# Patient Record
Sex: Male | Born: 1990 | Race: White | Hispanic: No | Marital: Married | Smoking: Never smoker
Health system: Southern US, Community
[De-identification: ages and names within clinical notes are randomized; demographics above are authoritative.]

## PROBLEM LIST (undated history)

## (undated) DIAGNOSIS — Z789 Other specified health status: Secondary | ICD-10-CM

## (undated) HISTORY — DX: Other specified health status: Z78.9

## (undated) HISTORY — PX: NO PAST SURGERIES: SHX2092

---

## 2011-03-04 ENCOUNTER — Encounter: Payer: Self-pay | Admitting: Family Medicine

## 2011-03-04 ENCOUNTER — Ambulatory Visit (INDEPENDENT_AMBULATORY_CARE_PROVIDER_SITE_OTHER): Payer: BC Managed Care – PPO | Admitting: Family Medicine

## 2011-03-04 VITALS — BP 110/80 | HR 80 | Temp 98.5°F | Ht 72.0 in | Wt 204.8 lb

## 2011-03-04 DIAGNOSIS — Z0000 Encounter for general adult medical examination without abnormal findings: Secondary | ICD-10-CM | POA: Insufficient documentation

## 2011-03-04 NOTE — Patient Instructions (Addendum)
Good to meet you today.  Call us with questions.  Use sunscreen when outside for prolonged time (>10-22min). Look into recent blood work and bring [place] a copy for your records. Come back as needed or in 2 years for next checkup. Good luck with school!  Keeping you healthy  Get these tests  Blood pressure- Have your blood pressure checked once a year by your healthcare provider.  Normal blood pressure is 120/80.  Weight- Have your body mass index (BMI) calculated to screen for obesity.  BMI is a measure of body fat based on height and weight. You can also calculate your own BMI at https://www.west-esparza.com/.  Diabetes- Have your blood sugar checked regularly if you have high blood pressure, high cholesterol, a family history of diabetes or if you are overweight.  Get these vaccines  Flu shot- Every fall.  Tetanus shot- Every 10 years.  Menactra- Single dose; prevents meningitis.  Take these steps  Don't smoke- If you do smoke, ask your healthcare provider about quitting. For tips on how to quit, go to www.smokefree.gov or call 1-800-QUIT-NOW.  Be physically active- Exercise 5 days a week for at least 30 minutes.  If you are not already physically active start slow and gradually work up to 30 minutes of moderate physical activity.  Examples of moderate activity include walking briskly, mowing the yard, dancing, swimming bicycling, etc.  Eat a healthy diet- Eat a variety of healthy foods such as fruits, vegetables, low fat milk, low fat cheese, yogurt, lean meats, poultry, fish, beans, tofu, etc.  For more information on healthy eating, go to www.thenutritionsource.org  Drink alcohol in moderation- Limit alcohol intake two drinks or less a day.  Never drink and drive.  Dentist- Brush and floss teeth twice daily; visit your dentis twice a year.  Depression-Your emotional health is as important as your physical health.  If you're feeling down, losing interest in things you normally enjoy  please talk with your healthcare provider.  Gun Safety- If you keep a gun in your home, keep it unloaded and with the safety lock on.  Bullets should be stored separately.  Helmet use- Always wear a helmet when riding a motorcycle, bicycle, rollerblading or skateboarding.  Safe sex- If you may be exposed to a sexually transmitted infection, use a condom  Seat belts- Seat bels can save your life; always wear one.  Smoke/Carbon Monoxide detectors- These detectors need to be installed on the appropriate level of your home.  Replace batteries at least once a year.  Skin Cancer- When out in the sun, cover up and use sunscreen SPF 15 or higher.  Violence- If anyone is threatening or hurting you, please tell your healthcare provider.

## 2011-03-04 NOTE — Progress Notes (Signed)
Subjective:    Patient ID: Jaclyn Prime Schuknecht, male    DOB: Mar 12, 1990, 21 y.o.   MRN: [id number]  HPI CC: new pt, CPE  Would like CPE today.  New pt, first time here.  No concerns today.  Lives with wife.  No children, no pets.  Preventative: Unsure last CPE.   No recent blood work.  Had blood work done for [employer].  Will check at home for copy of latest labs. Flu - did not receive Tetanus - ~2008.   Seat belt use 100% Discussed sunscreen use.  Caffeine: 1 cup coffee/day Lives with wife, [name], no pets Occupation: Consulting civil engineer - Arts administrator, senior, middle school education Edu: college Activity: basketball, running Diet: water, fruits/vegetables daily, red meat 3-4x/wk, fish 1-2x/wk  Medications and allergies reviewed and updated in chart.  Past histories reviewed and updated if relevant as below. There is no problem list on file for this patient.  Past Medical History  Diagnosis Date  . No pertinent past medical history    Past Surgical History  Procedure Date  . No past surgeries    History  Substance Use Topics  . Smoking status: Never Smoker   . Smokeless tobacco: Never Used  . Alcohol Use: Yes     once weekly, 3-4 beers at a time, no binging   Family History  Problem Relation Age of Onset  . Stroke Maternal Grandfather 60  . Coronary artery disease Neg Hx   . Diabetes Neg Hx   . Cancer Neg Hx   . Hypertension Neg Hx   . Hyperlipidemia Neg Hx    No Known Allergies No current outpatient prescriptions on file prior to visit.     Review of Systems  Constitutional: Negative for fever, chills, activity change, appetite change, fatigue and unexpected weight change.  HENT: Negative for hearing loss and neck pain.   Eyes: Negative for visual disturbance.  Respiratory: Negative for cough, chest tightness, shortness of breath and wheezing.   Cardiovascular: Negative for chest pain, palpitations and leg swelling.  Gastrointestinal: Negative for nausea, vomiting,  abdominal pain, diarrhea, constipation, blood in stool and abdominal distention.  Genitourinary: Negative for hematuria and difficulty urinating.  Musculoskeletal: Negative for myalgias and arthralgias.  Skin: Negative for rash.  Neurological: Negative for dizziness, seizures, syncope and headaches.  Hematological: Does not bruise/bleed easily.  Psychiatric/Behavioral: Negative for dysphoric mood. The patient is not nervous/anxious.        Objective:   Physical Exam  Nursing note and vitals reviewed. Constitutional: He is oriented to person, place, and time. He appears well-developed and well-nourished. No distress.  HENT:  Head: Normocephalic and atraumatic.  Right Ear: Hearing, tympanic membrane, external ear and ear canal normal.  Left Ear: Hearing, tympanic membrane, external ear and ear canal normal.  Nose: Nose normal.  Mouth/Throat: Uvula is midline, oropharynx is clear and moist and mucous membranes are normal. No oropharyngeal exudate, posterior oropharyngeal edema, posterior oropharyngeal erythema or tonsillar abscesses.  Eyes: Conjunctivae and EOM are normal. Pupils are equal, round, and reactive to light. No scleral icterus.  Neck: Normal range of motion. Neck supple. No thyromegaly present.  Cardiovascular: Normal rate, regular rhythm, normal heart sounds and intact distal pulses.   No murmur heard. Pulses:      Radial pulses are 2+ on the right side, and 2+ on the left side.  Pulmonary/Chest: Effort normal and breath sounds normal. No respiratory distress. He has no wheezes. He has no rales.  Abdominal: Soft. Bowel sounds are  normal. He exhibits no distension and no mass. There is no tenderness. There is no rebound and no guarding.  Musculoskeletal: Normal range of motion. He exhibits no edema.  Lymphadenopathy:    He has no cervical adenopathy.  Neurological: He is alert and oriented to person, place, and time.       CN grossly intact, station and gait intact  Skin:  Skin is warm and dry. No rash noted.  Psychiatric: He has a normal mood and affect. His behavior is normal. Judgment and thought content normal.       Assessment & Plan:

## 2011-03-04 NOTE — Assessment & Plan Note (Signed)
Preventative protocols reviewed and updated. Discussed healthy living, rec sunscreen use. rtc 2 yrs for next CPE, sooner if needed. Pt will bring copy of latest blood work (last year) for records.

## 2011-04-04 ENCOUNTER — Emergency Department: Payer: Self-pay | Admitting: Emergency Medicine

## 2011-04-10 ENCOUNTER — Encounter: Payer: Self-pay | Admitting: Family Medicine

## 2011-04-10 ENCOUNTER — Ambulatory Visit (INDEPENDENT_AMBULATORY_CARE_PROVIDER_SITE_OTHER): Payer: BC Managed Care – PPO | Admitting: Family Medicine

## 2011-04-10 VITALS — BP 104/72 | HR 76 | Temp 98.4°F | Wt 200.8 lb

## 2011-04-10 DIAGNOSIS — S0180XA Unspecified open wound of other part of head, initial encounter: Secondary | ICD-10-CM

## 2011-04-10 DIAGNOSIS — S0181XA Laceration without foreign body of other part of head, initial encounter: Secondary | ICD-10-CM

## 2011-04-10 DIAGNOSIS — J069 Acute upper respiratory infection, unspecified: Secondary | ICD-10-CM

## 2011-04-10 NOTE — Assessment & Plan Note (Signed)
Suture removed.  healing well. Continue to monitor. Discussed red flags to return or seek care - ie spreading redness, worsening pain, draining pus or not healing as expected.

## 2011-04-10 NOTE — Assessment & Plan Note (Signed)
Anticipate viral. supportive care as per instructions. Discussed sxs to watch for bacterial superinfection.

## 2011-04-10 NOTE — Progress Notes (Signed)
  Subjective:    Patient ID: Eric Morse, male    DOB: 1990-04-12, 21 y.o.   MRN: [id number]  HPI CC: f/u [facility], stitch removal, URI?  DOI: 3/22 - playing basketball, elbow to face. Seen at ER 3/22 with lac to face, placed 1 stitch. Denies redness, draining, residual bleeding.  4d h/o URI sxs - coryza, ST, RN, PNdrainage.  No coughing.  No fevers/chills, abd pain.  So far tried nothing for this OTC.  Wife recently with sinus infection.  Review of Systems Per HPI    Objective:   Physical Exam  Nursing note and vitals reviewed. Constitutional: He appears well-developed and well-nourished. No distress.  HENT:  Head: Normocephalic and atraumatic.    Right Ear: Hearing and external ear normal.  Left Ear: Hearing, tympanic membrane, external ear and ear canal normal.  Nose: Nose normal. No mucosal edema or rhinorrhea.  Mouth/Throat: Uvula is midline, oropharynx is clear and moist and mucous membranes are normal. No oropharyngeal exudate, posterior oropharyngeal edema, posterior oropharyngeal erythema or tonsillar abscesses.       R canal with cerumen L chin with laceration that goes through to mouth - lower lip.  Scab in place. No spreading erythema on outside or pus drainage.  Inside with minimal erythema surrounding lesion   Eyes: Conjunctivae and EOM are normal. Pupils are equal, round, and reactive to light. No scleral icterus.  Neck: Normal range of motion. Neck supple.  Cardiovascular: Normal rate, regular rhythm, normal heart sounds and intact distal pulses.   No murmur heard. Pulmonary/Chest: Effort normal and breath sounds normal. No respiratory distress. He has no wheezes. He has no rales.  Lymphadenopathy:    He has no cervical adenopathy.  Skin: Skin is warm and dry. No rash noted.  Psychiatric: He has a normal mood and affect.       Assessment & Plan:  1 suture removed with suture removal scissors. Pt tolerated procedure well.

## 2011-05-26 ENCOUNTER — Encounter: Payer: Self-pay | Admitting: Family Medicine

## 2011-05-26 ENCOUNTER — Ambulatory Visit (INDEPENDENT_AMBULATORY_CARE_PROVIDER_SITE_OTHER): Payer: BC Managed Care – PPO | Admitting: Family Medicine

## 2011-05-26 ENCOUNTER — Ambulatory Visit: Payer: BC Managed Care – PPO | Admitting: Family Medicine

## 2011-05-26 VITALS — BP 112/80 | HR 62 | Temp 97.9°F | Ht 72.0 in | Wt 197.0 lb

## 2011-05-26 DIAGNOSIS — M7670 Peroneal tendinitis, unspecified leg: Secondary | ICD-10-CM

## 2011-05-26 DIAGNOSIS — M7750 Other enthesopathy of unspecified foot: Secondary | ICD-10-CM

## 2011-05-26 NOTE — Patient Instructions (Signed)
Peroneal Tendon rehab: start next week Begin with easy walking, heel, toe and backwards * Try to pick an easy location like a hallway or a room in your house and do one of these each time that you go through this area.  Calf raises on a step - Pidgeon Toes, with toes turned inward Try to do most days of the week If pain persists at 3 sets of 30 - add backpack with 5 lbs Increase by 5 lbs per week to max of 30 lbs  Towel "Scrunch Ups" Use a hand towel or a moderate size towel Foot flat down on the towel Use toes to "scrunch up the towel" straight up and down, and going to the right and left.  3 sets of 20 * Can be done watching TV, reading, or sitting and relaxing.

## 2011-05-26 NOTE — Progress Notes (Signed)
  Patient Name: Eric Morse Date of Birth: 10/16/1990 Age: 21 y.o. Medical Record Number: [id number] Gender: male Date of Encounter: 05/26/2011  History of Present Illness:  Eric Morse is a 21 y.o. very pleasant male patient who presents with the following:  1 week ran 6 days ago, lateral foot pain after running 13 miles. He is training for a half marathon. He is a regular runner, and usually runs about 20 miles a week. He is able to walk, and it has gotten better a little bit in the last 1-2 days.  Primarily he has pain with eversion. No bruising. No swelling.  Past Medical History, Surgical History, Social History, Family History, Problem List, Medications, and Allergies have been reviewed and updated if relevant.  Review of Systems:  GEN: No fevers, chills. Nontoxic. Primarily MSK c/o today. MSK: Detailed in the HPI GI: tolerating PO intake without difficulty Neuro: No numbness, parasthesias, or tingling associated. Otherwise the pertinent positives of the ROS are noted above.    Physical Examination: Filed Vitals:   05/26/11 1104  BP: 112/80  Pulse: 62  Temp: 97.9 F (36.6 C)  TempSrc: Oral  Height: 6' (1.829 m)  Weight: 197 lb (89.359 kg)  SpO2: 98%    Body mass index is 26.72 kg/(m^2).   GEN: WDWN, NAD, Non-toxic, Alert & Oriented x 3 HEENT: Atraumatic, Normocephalic.  Ears and Nose: No external deformity. EXTR: No clubbing/cyanosis/edema NEURO: Normal gait.  PSYCH: Normally interactive. Conversant. Not depressed or anxious appearing.  Calm demeanor.   Entirety of his bony anatomy is nontender in the RIGHT foot. 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 Pain is primarily with eversion and he is unable to fully go up on his toes and fever his foot on one foot.  Assessment and Plan: 1. Peroneal tendonitis     Refer to the patient instructions sections for details of plan shared with patient.   rtp after about a week, more cross training for a week or so  Ankle  support during work, when starting back running

## 2011-06-12 ENCOUNTER — Encounter: Payer: Self-pay | Admitting: Family Medicine

## 2011-06-12 ENCOUNTER — Ambulatory Visit (INDEPENDENT_AMBULATORY_CARE_PROVIDER_SITE_OTHER): Payer: BC Managed Care – PPO | Admitting: Family Medicine

## 2011-06-12 VITALS — BP 110/70 | HR 72 | Temp 97.4°F | Wt 197.2 lb

## 2011-06-12 DIAGNOSIS — L600 Ingrowing nail: Secondary | ICD-10-CM | POA: Insufficient documentation

## 2011-06-12 NOTE — Patient Instructions (Signed)
Warm water soaks or warm compresses to help with inflammation.  pass by [facility]'s office for referral to podiatrist.

## 2011-06-12 NOTE — Assessment & Plan Note (Signed)
Unable to raise toenail edge past lateral nail fold. Will likely need partial toenail removal as more definitive treatment, will refer to podiatry. rec warm compresses for next few days to calm down inflammation. No obvious infection currently, improved over last few days.

## 2011-06-12 NOTE — Progress Notes (Signed)
  Subjective:    Patient ID: Eric Morse, male    DOB: [birth year], 21 y.o.   MRN: [id number]  HPI CC: R big toe pain  Training for 1/2 marathon in October in [place] area.  Doing 5-6 mi a few times a week.  Since last seen by myself, seen by Dr. Salena Saner and dx with R peroneal tendonitis.  That has improved.  R big toe pain worse over last few days.  Has been sensitive for last few years.  Cut toenail on Monday and after that very painful and sensitive to touch.  Also had some blood and pus drain from toe recently.    No h/o ingrown toenail in past.  Medications and allergies reviewed and updated in chart.  Past histories reviewed and updated if relevant as below. Patient Active Problem List  Diagnoses  . Health care maintenance  . Laceration of face  . Viral URI  . Ingrown toenail   Past Medical History  Diagnosis Date  . No pertinent past medical history    Past Surgical History  Procedure Date  . No past surgeries    History  Substance Use Topics  . Smoking status: Never Smoker   . Smokeless tobacco: Never Used  . Alcohol Use: Yes     once weekly, 3-4 beers at a time, no binging   Family History  Problem Relation Age of Onset  . Stroke Maternal Grandfather 60  . Coronary artery disease Neg Hx   . Diabetes Neg Hx   . Cancer Neg Hx   . Hypertension Neg Hx   . Hyperlipidemia Neg Hx    No Known Allergies No current outpatient prescriptions on file prior to visit.    Review of Systems Per HPI    Objective:   Physical Exam  Nursing note and vitals reviewed. Constitutional: He appears well-developed and well-nourished. No distress.  Cardiovascular:  Pulses:      Dorsalis pedis pulses are 2+ on the right side, and 2+ on the left side.  Musculoskeletal:       R great toe lateral edge with some swelling and tender to palpation, no significant erythema or warmth. Lateral nail edge growing into distal toe.  Skin: Skin is warm and dry. No rash noted.         Assessment & Plan:

## 2012-01-01 ENCOUNTER — Telehealth: Payer: Self-pay | Admitting: Family Medicine

## 2012-01-01 DIAGNOSIS — IMO0001 Reserved for inherently not codable concepts without codable children: Secondary | ICD-10-CM

## 2012-01-01 NOTE — Telephone Encounter (Signed)
Received request to fill out physical form for job. He needs to come in for office visit to discuss.  May need blood work to check immunization titers - or bring me copy of immunization records. Placed form in Kim's box

## 2012-01-01 NOTE — Telephone Encounter (Signed)
Patient notified and appt scheduled. He's not sure if he can locate vaccine records.

## 2012-01-05 NOTE — Telephone Encounter (Addendum)
Spoke with pt and discussed all below - actually had physical 02/2011.  Doesn't need appt for forms to fill out. Selena Batten - can we call to reschedule nurse visit for PPD placement and at that time draw blood work for titers. Thinks received Td 2008, but unsure.  If does not bring immunization record, let's give him Tdap when he comes in for blood work. Placed work form in [employer]

## 2012-01-05 NOTE — Telephone Encounter (Signed)
Patient notified and nurse visit and lab appt scheduled. OV cancelled.

## 2012-01-05 NOTE — Telephone Encounter (Signed)
Please order labs.

## 2012-01-06 NOTE — Addendum Note (Signed)
Addended by: Eustaquio Boyden on: 01/06/2012 01:23 PM   Modules accepted: Orders

## 2012-01-08 ENCOUNTER — Ambulatory Visit: Payer: BC Managed Care – PPO | Admitting: Family Medicine

## 2012-01-16 ENCOUNTER — Other Ambulatory Visit (INDEPENDENT_AMBULATORY_CARE_PROVIDER_SITE_OTHER): Payer: BC Managed Care – PPO

## 2012-01-16 ENCOUNTER — Ambulatory Visit: Payer: BC Managed Care – PPO

## 2012-01-16 DIAGNOSIS — Z9189 Other specified personal risk factors, not elsewhere classified: Secondary | ICD-10-CM

## 2012-01-16 DIAGNOSIS — IMO0001 Reserved for inherently not codable concepts without codable children: Secondary | ICD-10-CM

## 2012-01-17 LAB — HEPATITIS B SURFACE ANTIBODY,QUALITATIVE: Hep B S Ab: REACTIVE — AB

## 2012-01-19 ENCOUNTER — Ambulatory Visit (INDEPENDENT_AMBULATORY_CARE_PROVIDER_SITE_OTHER): Payer: BC Managed Care – PPO | Admitting: *Deleted

## 2012-01-19 DIAGNOSIS — Z23 Encounter for immunization: Secondary | ICD-10-CM

## 2012-01-19 DIAGNOSIS — Z139 Encounter for screening, unspecified: Secondary | ICD-10-CM

## 2012-01-19 LAB — MUMPS ANTIBODY, IGG: Mumps IgG: 3.15 {ISR} — ABNORMAL HIGH

## 2012-01-19 LAB — VARICELLA ZOSTER ANTIBODY, IGG: Varicella IgG: 0.58 {ISR} (ref <0.90)

## 2012-01-19 LAB — RUBELLA SCREEN: Rubella: 11.1 Index — ABNORMAL HIGH (ref <0.90)

## 2012-01-19 LAB — RUBEOLA ANTIBODY IGG: Rubeola IgG: 3.46 {ISR} — ABNORMAL HIGH

## 2012-01-21 LAB — TB SKIN TEST
Induration: NEGATIVE mm
TB Skin Test: NEGATIVE

## 2012-08-04 ENCOUNTER — Telehealth: Payer: Self-pay | Admitting: Family Medicine

## 2012-08-04 NOTE — Telephone Encounter (Signed)
Phill Mutter dropped off a Health Exam Certificate form to be completed by Dr. Sharen Hones. Please call him when the form is ready to be picked up at [phone].  Thanks, Revonda Standard

## 2012-08-04 NOTE — Telephone Encounter (Signed)
Form for completion in your IN box. He hasn't had a physical since 2/13. I know he will need PPD, hearing and vision, but do you need to see him or will nurse visit be ok?

## 2012-08-05 NOTE — Telephone Encounter (Signed)
Message left for patient to return my call and schedule appt.

## 2012-08-05 NOTE — Telephone Encounter (Signed)
plz have him schedule physical (may be 15 min slot).   Form placed in Kim's box.

## 2012-08-09 NOTE — Telephone Encounter (Signed)
Pt called and scheduled 15 min appt 08/10/12 at 8 am.

## 2012-08-10 ENCOUNTER — Encounter: Payer: Self-pay | Admitting: Family Medicine

## 2012-08-10 ENCOUNTER — Ambulatory Visit (INDEPENDENT_AMBULATORY_CARE_PROVIDER_SITE_OTHER): Payer: BC Managed Care – PPO | Admitting: Family Medicine

## 2012-08-10 VITALS — BP 124/78 | HR 68 | Temp 98.0°F | Ht 72.0 in | Wt 193.2 lb

## 2012-08-10 DIAGNOSIS — Z23 Encounter for immunization: Secondary | ICD-10-CM

## 2012-08-10 DIAGNOSIS — R229 Localized swelling, mass and lump, unspecified: Secondary | ICD-10-CM | POA: Insufficient documentation

## 2012-08-10 DIAGNOSIS — Z0000 Encounter for general adult medical examination without abnormal findings: Secondary | ICD-10-CM

## 2012-08-10 NOTE — Progress Notes (Signed)
Subjective:    Patient ID: Eric Morse, male    DOB: 1990/08/14, 22 y.o.   MRN: [id number]  HPI CC: CPE  Here for forms to fill out for work - [employer] system.  Starting new job.  To start at [employer], [employer]. Overall doing well, no concerns or complaints.  Would like bump on leg evaluated.  No change since high school.  Hearing and vision screens passed today.  Preventative:  Unsure last CPE.  No recent blood work. Had blood work done for [employer]. Will check at home for copy of latest labs.  Flu - did not receive  Tdap 01/2012 PPD 01/2012 negative  Seat belt use 100%. Discussed sunscreen use.  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  Past histories reviewed and updated if relevant as below. Patient Active Problem List   Diagnosis Date Noted  . Ingrown toenail 06/12/2011  . Health care maintenance 03/04/2011   Past Medical History  Diagnosis Date  . No pertinent past medical history    Past Surgical History  Procedure Laterality Date  . No past surgeries     History  Substance Use Topics  . Smoking status: Never Smoker   . Smokeless tobacco: Never Used  . Alcohol Use: Yes     Comment: once weekly, 3-4 beers at a time, no binging   Family History  Problem Relation Age of Onset  . Stroke Maternal Grandfather 60  . Coronary artery disease Neg Hx   . Diabetes Neg Hx   . Cancer Neg Hx   . Hypertension Neg Hx   . Hyperlipidemia Neg Hx    No Known Allergies No current outpatient prescriptions on file prior to visit.   No current facility-administered medications on file prior to visit.     Review of Systems  Constitutional: Negative for fever,  chills, activity change, appetite change, fatigue and unexpected weight change.  HENT: Negative for hearing loss and neck pain.   Eyes: Negative for visual disturbance.  Respiratory: Negative for cough, chest tightness, shortness of breath and wheezing.   Cardiovascular: Negative for chest pain, palpitations and leg swelling.  Gastrointestinal: Negative for nausea, vomiting, abdominal pain, diarrhea, constipation, blood in stool and abdominal distention.  Genitourinary: Negative for hematuria and difficulty urinating.  Musculoskeletal: Negative for myalgias and arthralgias.  Skin: Negative for rash.  Neurological: Negative for dizziness, seizures, syncope and headaches.  Hematological: Negative for adenopathy. Does not bruise/bleed easily.  Psychiatric/Behavioral: Negative for dysphoric mood. The patient is not nervous/anxious.        Objective:   Physical Exam  Nursing note and vitals reviewed. Constitutional: He is oriented to person, place, and time. He appears well-developed and well-nourished. No distress.  HENT:  Head: Normocephalic and atraumatic.  Right Ear: Hearing, tympanic membrane, external ear and ear canal normal.  Left Ear: Hearing, tympanic membrane, external ear and ear canal normal.  Nose: Nose normal.  Mouth/Throat: Oropharynx is clear and moist. No oropharyngeal exudate.  Eyes: Conjunctivae and EOM are normal. Pupils are equal, round, and reactive to light. No scleral icterus.  Neck: Normal range of motion. Neck supple. No thyromegaly present.  Cardiovascular: Normal rate, regular rhythm, normal heart sounds and intact distal pulses.   No murmur  heard. Pulses:      Radial pulses are 2+ on the right side, and 2+ on the left side.  Pulmonary/Chest: Effort normal and breath sounds normal. No respiratory distress. He has no wheezes. He has no rales.  Abdominal: Soft. Bowel sounds are normal. He exhibits no distension and no mass. There is no tenderness. There is no  rebound and no guarding.  Musculoskeletal: Normal range of motion.  Lymphadenopathy:    He has no cervical adenopathy.  Neurological: He is alert and oriented to person, place, and time.  CN grossly intact, station and gait intact  Skin: Skin is warm and dry. No rash noted.  R anterior thigh with 2cm superficial fixed fluid filled mass, overlying taught skin  Psychiatric: He has a normal mood and affect. His behavior is normal. Judgment and thought content normal.       Assessment & Plan:

## 2012-08-10 NOTE — Assessment & Plan Note (Signed)
Preventative protocols reviewed and updated unless pt declined. Discussed healthy diet and lifestyle. RTC PRN. No need for blood work today.

## 2012-08-10 NOTE — Patient Instructions (Addendum)
Good to see you today! Call us with questions. Your last PPD (TB skin test) was 01/19/2012. Second and final varicella shot done today. Return as needed or in 2 years for next physical

## 2012-08-10 NOTE — Assessment & Plan Note (Signed)
?  cyst vs subcutaneous hemangioma.  Monitor for now as not changing. If pt desires removal, or if any change, discussed referral to surgeon. Pt agrees with plan.

## 2012-08-10 NOTE — Addendum Note (Signed)
Addended by: Josph Macho A on: 08/10/2012 08:43 AM   Modules accepted: Orders

## 2015-02-14 ENCOUNTER — Other Ambulatory Visit: Payer: Self-pay | Admitting: Family Medicine

## 2015-02-14 DIAGNOSIS — Z13220 Encounter for screening for lipoid disorders: Secondary | ICD-10-CM

## 2015-02-14 DIAGNOSIS — Z131 Encounter for screening for diabetes mellitus: Secondary | ICD-10-CM

## 2015-02-16 ENCOUNTER — Other Ambulatory Visit (INDEPENDENT_AMBULATORY_CARE_PROVIDER_SITE_OTHER): Payer: BC Managed Care – PPO

## 2015-02-16 DIAGNOSIS — Z13220 Encounter for screening for lipoid disorders: Secondary | ICD-10-CM | POA: Diagnosis not present

## 2015-02-16 DIAGNOSIS — Z131 Encounter for screening for diabetes mellitus: Secondary | ICD-10-CM

## 2015-02-16 LAB — BASIC METABOLIC PANEL
BUN: 18 mg/dL (ref 7–25)
CO2: 27 mmol/L (ref 20–31)
Calcium: 9.9 mg/dL (ref 8.6–10.3)
Chloride: 103 mmol/L (ref 98–110)
Creat: 0.94 mg/dL (ref 0.60–1.35)
Glucose, Bld: 91 mg/dL (ref 65–99)
Potassium: 4.5 mmol/L (ref 3.5–5.3)
Sodium: 140 mmol/L (ref 135–146)

## 2015-02-16 LAB — LIPID PANEL
Cholesterol: 173 mg/dL (ref 125–200)
HDL: 46 mg/dL (ref >=40)
LDL Cholesterol: 108 mg/dL (ref <130)
Total CHOL/HDL Ratio: 3.8 Ratio (ref <=5.0)
Triglycerides: 94 mg/dL (ref <150)
VLDL: 19 mg/dL (ref <30)

## 2015-02-22 ENCOUNTER — Ambulatory Visit (INDEPENDENT_AMBULATORY_CARE_PROVIDER_SITE_OTHER): Payer: BC Managed Care – PPO | Admitting: Family Medicine

## 2015-02-22 ENCOUNTER — Encounter: Payer: Self-pay | Admitting: Family Medicine

## 2015-02-22 VITALS — BP 102/80 | HR 69 | Temp 97.7°F | Ht 72.0 in | Wt 202.0 lb

## 2015-02-22 DIAGNOSIS — Z0000 Encounter for general adult medical examination without abnormal findings: Secondary | ICD-10-CM | POA: Diagnosis not present

## 2015-02-22 NOTE — Progress Notes (Signed)
Pre visit review using our clinic review tool, if applicable. No additional management support is needed unless otherwise documented below in the visit note. 

## 2015-02-22 NOTE — Progress Notes (Signed)
BP 102/80 mmHg  Pulse 69  Temp(Src) 97.7 F (36.5 C) (Oral)  Ht 6' (1.829 m)  Wt 202 lb (91.627 kg)  BMI 27.39 kg/m2  SpO2 98%   CC: CPE  Subjective:    Patient ID: Eric Morse, male    DOB: [birth year], 25 y.o.   MRN: [id number]  HPI: Eric Morse is a 25 y.o. male presenting on 02/22/2015 for Annual Exam   Goes to [place], [place] every summer "Back to Back" works in [employer].   No known TB exposure. No cough, fever, night sweats. No unexpected weight changes. No work at [employer] or prison.  Preventative: Flu - declines  Tdap 01/2012 PPD 01/2012 negative Seat belt use 100%. Discussed sunscreen use. 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 Interim medical history since our last visit reviewed. Allergies and medications reviewed and updated. No current outpatient prescriptions on file prior to visit.   No current facility-administered medications on file prior to visit.    Review of Systems  Constitutional: Negative for fever, chills, activity change, appetite change, fatigue and unexpected weight change.  HENT: Negative for hearing loss.   Eyes: Negative for visual disturbance.  Respiratory: Negative for cough, chest tightness, shortness of breath and wheezing.   Cardiovascular: Negative for chest pain, palpitations and leg swelling.  Gastrointestinal: Negative for nausea, vomiting, abdominal pain, diarrhea, constipation, blood in stool and abdominal distention.  Genitourinary: Negative for hematuria and difficulty urinating.  Musculoskeletal: Negative for myalgias,  arthralgias and neck pain.  Skin: Negative for rash.  Neurological: Negative for dizziness, seizures, syncope and headaches.  Hematological: Negative for adenopathy. Does not bruise/bleed easily.  Psychiatric/Behavioral: Negative for dysphoric mood. The patient is not nervous/anxious.    Per HPI unless specifically indicated in ROS section     Objective:    BP 102/80 mmHg  Pulse 69  Temp(Src) 97.7 F (36.5 C) (Oral)  Ht 6' (1.829 m)  Wt 202 lb (91.627 kg)  BMI 27.39 kg/m2  SpO2 98%  Wt Readings from Last 3 Encounters:  02/22/15 202 lb (91.627 kg)  08/10/12 193 lb 4 oz (87.658 kg)  06/12/11 197 lb 4 oz (89.472 kg)    Physical Exam  Constitutional: He is oriented to person, place, and time. He appears well-developed and well-nourished. No distress.  HENT:  Head: Normocephalic and atraumatic.  Right Ear: Hearing, tympanic membrane, external ear and ear canal normal.  Left Ear: Hearing, tympanic membrane, external ear and ear canal normal.  Nose: Nose normal.  Mouth/Throat: Uvula is midline, oropharynx is clear and moist and mucous membranes are normal. No oropharyngeal exudate, posterior oropharyngeal edema or posterior oropharyngeal erythema.  Eyes: Conjunctivae and EOM are normal. Pupils are equal, round, and reactive to light. No scleral icterus.  Neck: Normal range of motion. Neck supple. No thyromegaly present.  Cardiovascular: Normal rate, regular rhythm, normal heart sounds and intact distal pulses.   No murmur heard. Pulses:      Radial pulses are 2+ on the right side, and 2+ on the left side.  Pulmonary/Chest: Effort normal and breath sounds normal. No respiratory distress. He has no wheezes. He has  no rales.  Abdominal: Soft. Bowel sounds are normal. He exhibits no distension and no mass. There is no tenderness. There is no rebound and no guarding.  Musculoskeletal: Normal range of motion. He exhibits no edema.  Lymphadenopathy:    He has no cervical adenopathy.    Neurological: He is alert and oriented to person, place, and time.  CN grossly intact, station and gait intact  Skin: Skin is warm and dry. No rash noted.  Psychiatric: He has a normal mood and affect. His behavior is normal. Judgment and thought content normal.  Nursing note and vitals reviewed.  Results for orders placed or performed in visit on 02/16/15  Basic metabolic panel  Result Value Ref Range   Sodium 140 135 - 146 mmol/L   Potassium 4.5 3.5 - 5.3 mmol/L   Chloride 103 98 - 110 mmol/L   CO2 27 20 - 31 mmol/L   Glucose, Bld 91 65 - 99 mg/dL   BUN 18 7 - 25 mg/dL   Creat 2.95 6.21 - 3.08 mg/dL   Calcium 9.9 8.6 - 65.7 mg/dL  Lipid panel  Result Value Ref Range   Cholesterol 173 125 - 200 mg/dL   Triglycerides 94 <846 mg/dL   HDL 46 >=96 mg/dL   Total CHOL/HDL Ratio 3.8 <=5.0 Ratio   VLDL 19 <30 mg/dL   LDL Cholesterol 295 <284 mg/dL      Assessment & Plan:   Problem List Items Addressed This Visit    Health care maintenance - Primary    Preventative protocols reviewed and updated unless pt declined. Discussed healthy diet and lifestyle.           Follow up plan: Return in about 2 years (around 02/21/2017), or as needed, for annual exam, prior fasting for blood work.

## 2015-02-22 NOTE — Addendum Note (Signed)
Addended by: Eustaquio Boyden on: 02/22/2015 06:33 PM   Modules accepted: Kipp Brood

## 2015-02-22 NOTE — Patient Instructions (Signed)
You are doing well today Return as needed or over next few years for next physical.  Health Maintenance, Male A healthy lifestyle and preventative care can promote health and wellness.  Maintain regular health, dental, and eye exams.  Eat a healthy diet. Foods like vegetables, fruits, whole grains, low-fat dairy products, and lean protein foods contain the nutrients you need and are low in calories. Decrease your intake of foods high in solid fats, added sugars, and salt. Get information about a proper diet from your health care provider, if necessary.  Regular physical exercise is one of the most important things you can do for your health. Most adults should get at least 150 minutes of moderate-intensity exercise (any activity that increases your heart rate and causes you to sweat) each week. In addition, most adults need muscle-strengthening exercises on 2 or more days a week.   Maintain a healthy weight. The body mass index (BMI) is a screening tool to identify possible weight problems. It provides an estimate of body fat based on height and weight. Your health care provider can find your BMI and can help you achieve or maintain a healthy weight. For males 20 years and older:  A BMI below 18.5 is considered underweight.  A BMI of 18.5 to 24.9 is normal.  A BMI of 25 to 29.9 is considered overweight.  A BMI of 30 and above is considered obese.  Maintain normal blood lipids and cholesterol by exercising and minimizing your intake of saturated fat. Eat a balanced diet with plenty of fruits and vegetables. Blood tests for lipids and cholesterol should begin at age 56 and be repeated every 5 years. If your lipid or cholesterol levels are high, you are over age 22, or you are at high risk for heart disease, you may need your cholesterol levels checked more frequently.Ongoing high lipid and cholesterol levels should be treated with medicines if diet and exercise are not working.  If you smoke,  find out from your health care provider how to quit. If you do not use tobacco, do not start.  Lung cancer screening is recommended for adults aged 55-80 years who are at high risk for developing lung cancer because of a history of smoking. A yearly low-dose CT scan of the lungs is recommended for people who have at least a 30-pack-year history of smoking and are current smokers or have quit within the past 15 years. A pack year of smoking is smoking an average of 1 pack of cigarettes a day for 1 year (for example, a 30-pack-year history of smoking could mean smoking 1 pack a day for 30 years or 2 packs a day for 15 years). Yearly screening should continue until the smoker has stopped smoking for at least 15 years. Yearly screening should be stopped for people who develop a health problem that would prevent them from having lung cancer treatment.  If you choose to drink alcohol, do not have more than 2 drinks per day. One drink is considered to be 12 oz (360 mL) of beer, 5 oz (150 mL) of wine, or 1.5 oz (45 mL) of liquor.  Avoid the use of street drugs. Do not share needles with anyone. Ask for help if you need support or instructions about stopping the use of drugs.  High blood pressure causes heart disease and increases the risk of stroke. High blood pressure is more likely to develop in:  People who have blood pressure in the end of the normal range (  100-139/85-89 mm Hg).  People who are overweight or obese.  People who are African American.  If you are 51-75 years of age, have your blood pressure checked every 3-5 years. If you are 56 years of age or older, have your blood pressure checked every year. You should have your blood pressure measured twice--once when you are at a hospital or clinic, and once when you are not at a hospital or clinic. Record the average of the two measurements. To check your blood pressure when you are not at a hospital or clinic, you can use:  An automated blood  pressure machine at a pharmacy.  A home blood pressure monitor.  If you are 7-74 years old, ask your health care provider if you should take aspirin to prevent heart disease.  Diabetes screening involves taking a blood sample to check your fasting blood sugar level. This should be done once every 3 years after age 41 if you are at a normal weight and without risk factors for diabetes. Testing should be considered at a younger age or be carried out more frequently if you are overweight and have at least 1 risk factor for diabetes.  Colorectal cancer can be detected and often prevented. Most routine colorectal cancer screening begins at the age of 75 and continues through age 65. However, your health care provider may recommend screening at an earlier age if you have risk factors for colon cancer. On a yearly basis, your health care provider may provide home test kits to check for hidden blood in the stool. A small camera at the end of a tube may be used to directly examine the colon (sigmoidoscopy or colonoscopy) to detect the earliest forms of colorectal cancer. Talk to your health care provider about this at age 99 when routine screening begins. A direct exam of the colon should be repeated every 5-10 years through age 76, unless early forms of precancerous polyps or small growths are found.  People who are at an increased risk for hepatitis B should be screened for this virus. You are considered at high risk for hepatitis B if:  You were born in a country where hepatitis B occurs often. Talk with your health care provider about which countries are considered high risk.  Your parents were born in a high-risk country and you have not received a shot to protect against hepatitis B (hepatitis B vaccine).  You have HIV or AIDS.  You use needles to inject street drugs.  You live with, or have sex with, someone who has hepatitis B.  You are a man who has sex with other men (MSM).  You get  hemodialysis treatment.  You take certain medicines for conditions like cancer, organ transplantation, and autoimmune conditions.  Hepatitis C blood testing is recommended for all people born from [birth year] through 1967 and any individual with known risk factors for hepatitis C.  Healthy men should no longer receive prostate-specific antigen (PSA) blood tests as part of routine cancer screening. Talk to your health care provider about prostate cancer screening.  Testicular cancer screening is not recommended for adolescents or adult males who have no symptoms. Screening includes self-exam, a health care provider exam, and other screening tests. Consult with your health care provider about any symptoms you have or any concerns you have about testicular cancer.  Practice safe sex. Use condoms and avoid high-risk sexual practices to reduce the spread of sexually transmitted infections (STIs).  You should be screened for STIs, including  gonorrhea and chlamydia if:  You are sexually active and are younger than 24 years.  You are older than 24 years, and your health care provider tells you that you are at risk for this type of infection.  Your sexual activity has changed since you were last screened, and you are at an increased risk for chlamydia or gonorrhea. Ask your health care provider if you are at risk.  If you are at risk of being infected with HIV, it is recommended that you take a prescription medicine daily to prevent HIV infection. This is called pre-exposure prophylaxis (PrEP). You are considered at risk if:  You are a man who has sex with other men (MSM).  You are a heterosexual man who is sexually active with multiple partners.  You take drugs by injection.  You are sexually active with a partner who has HIV.  Talk with your health care provider about whether you are at high risk of being infected with HIV. If you choose to begin PrEP, you should first be tested for HIV. You should  then be tested every 3 months for as long as you are taking PrEP.  Use sunscreen. Apply sunscreen liberally and repeatedly throughout the day. You should seek shade when your shadow is shorter than you. Protect yourself by wearing long sleeves, pants, a wide-brimmed hat, and sunglasses year round whenever you are outdoors.  Tell your health care provider of new moles or changes in moles, especially if there is a change in shape or color. Also, tell your health care provider if a mole is larger than the size of a pencil eraser.  A one-time screening for abdominal aortic aneurysm (AAA) and surgical repair of large AAAs by ultrasound is recommended for men aged 15-75 years who are current or former smokers.  Stay current with your vaccines (immunizations).   This information is not intended to replace advice given to you by your health care provider. Make sure you discuss any questions you have with your health care provider.   Document Released: 06/28/2007 Document Revised: 01/20/2014 Document Reviewed: 05/27/2010 Elsevier Interactive Patient Education [employer].

## 2015-02-22 NOTE — Assessment & Plan Note (Signed)
Preventative protocols reviewed and updated unless pt declined. Discussed healthy diet and lifestyle.  

## 2016-02-18 ENCOUNTER — Telehealth: Payer: Self-pay

## 2016-02-18 MED ORDER — OSELTAMIVIR PHOSPHATE 75 MG PO CAPS: 75.0000 mg | ORAL_CAPSULE | Freq: Every day | ORAL | 0 refills | Status: DC

## 2016-02-18 NOTE — Telephone Encounter (Signed)
Pt left v/m; pts wife dx with the flu this morning and pt request preventative tamiflu to [facility]. Please advise.

## 2016-02-18 NOTE — Telephone Encounter (Signed)
plz notify this was sent in. 

## 2016-02-19 NOTE — Telephone Encounter (Signed)
Message left advising patient.  

## 2017-01-07 ENCOUNTER — Ambulatory Visit: Payer: BC Managed Care – PPO | Admitting: Family Medicine

## 2017-01-07 ENCOUNTER — Encounter: Payer: Self-pay | Admitting: Family Medicine

## 2017-01-07 VITALS — BP 118/66 | HR 70 | Temp 98.0°F | Wt 212.0 lb

## 2017-01-07 DIAGNOSIS — L0291 Cutaneous abscess, unspecified: Secondary | ICD-10-CM | POA: Insufficient documentation

## 2017-01-07 DIAGNOSIS — L02413 Cutaneous abscess of right upper limb: Secondary | ICD-10-CM

## 2017-01-07 DIAGNOSIS — Z23 Encounter for immunization: Secondary | ICD-10-CM

## 2017-01-07 MED ORDER — DOXYCYCLINE HYCLATE 100 MG PO TABS: 100.0000 mg | ORAL_TABLET | Freq: Two times a day (BID) | ORAL | 0 refills | Status: DC

## 2017-01-07 NOTE — Progress Notes (Signed)
   BP 118/66 (BP Location: Left Arm, Patient Position: Sitting, Cuff Size: Normal)   Pulse 70   Temp 98 F (36.7 C) (Oral)   Wt 212 lb (96.2 kg)   SpO2 96%   BMI 28.75 kg/m    CC: check skin on arm Subjective:    Patient ID: Eric Primerew Scott Jerde, male    DOB: 06/29/1990, 26 y.o.   MRN: [id number]  HPI: Eric Morse is a 26 y.o. male presenting on 01/07/2017 for Skin lesion (Located on right lateral shoulder.  Area is red and painful. Noticed 2-3 wks ago. )   2 wk h/o bump R arm. Tender. Not itchy. No spreading redness.  Denies bug bites, skin scrape, etc.   Relevant past medical, surgical, family and social history reviewed and updated as indicated. Interim medical history since our last visit reviewed. Allergies and medications reviewed and updated. Outpatient Medications Prior to Visit  Medication Sig Dispense Refill  . oseltamivir (TAMIFLU) 75 MG capsule Take 1 capsule (75 mg total) by mouth daily. 10 capsule 0   No facility-administered medications prior to visit.      Per HPI unless specifically indicated in ROS section below Review of Systems     Objective:    BP 118/66 (BP Location: Left Arm, Patient Position: Sitting, Cuff Size: Normal)   Pulse 70   Temp 98 F (36.7 C) (Oral)   Wt 212 lb (96.2 kg)   SpO2 96%   BMI 28.75 kg/m   Wt Readings from Last 3 Encounters:  01/07/17 212 lb (96.2 kg)  02/22/15 202 lb (91.6 kg)  08/10/12 193 lb 4 oz (87.7 kg)    Physical Exam  Constitutional: He appears well-developed and well-nourished. No distress.  Musculoskeletal: He exhibits no edema.  Skin: Skin is warm and dry. No rash noted. There is erythema.  Developing abscess R lateral upper arm ~2cm diameter with induration but no true fluctuance Small wart R posterior elbow  Psychiatric: He has a normal mood and affect.  Nursing note and vitals reviewed.      Assessment & Plan:   Problem List Items Addressed This Visit    Abscess of skin - Primary    Developing  abscess RUE but I don't think we would be successful with I&D at this time. rec warm compresses, doxy course, and return if area comes to a head or worsening for I&D. Pt agrees with plan.           Follow up plan: Return if symptoms worsen or fail to improve.  Eustaquio BoydenJavier Kerith Sherley, MD

## 2017-01-07 NOTE — Patient Instructions (Addendum)
Flu shot today I think you have developing abscess but nothing to drain today.  Treat with doxycycline antibiotic 7 day course. Warm compresses at least 3 times a day to help it come to a head. If starts draining, ok to continue warm compresses.  If worsening pain or swelling or streaking redness, return for I&D.

## 2017-01-07 NOTE — Assessment & Plan Note (Signed)
Developing abscess RUE but I don't think we would be successful with I&D at this time. rec warm compresses, doxy course, and return if area comes to a head or worsening for I&D. Pt agrees with plan.

## 2017-02-04 ENCOUNTER — Ambulatory Visit (INDEPENDENT_AMBULATORY_CARE_PROVIDER_SITE_OTHER): Payer: BC Managed Care – PPO | Admitting: Family Medicine

## 2017-02-04 ENCOUNTER — Encounter: Payer: Self-pay | Admitting: Family Medicine

## 2017-02-04 VITALS — BP 120/80 | HR 63 | Temp 98.1°F | Ht 70.75 in | Wt 203.0 lb

## 2017-02-04 DIAGNOSIS — Z0000 Encounter for general adult medical examination without abnormal findings: Secondary | ICD-10-CM | POA: Diagnosis not present

## 2017-02-04 NOTE — Assessment & Plan Note (Signed)
Preventative protocols reviewed and updated unless pt declined. Discussed healthy diet and lifestyle.  

## 2017-02-04 NOTE — Progress Notes (Signed)
BP 120/80 (BP Location: Left Arm, Patient Position: Sitting, Cuff Size: Normal)   Pulse 63   Temp 98.1 F (36.7 C) (Oral)   Ht 5' 10.75" (1.797 m)   Wt 203 lb (92.1 kg)   SpO2 96%   BMI 28.51 kg/m    CC: CPE Subjective:    Patient ID: Eric Morse, male    DOB: 12-25-1990, 27 y.o.   MRN: [id number]  HPI: Eric Morse is a 27 y.o. male presenting on 02/04/2017 for Annual Exam   Goes to [place], [place] every summer "Back to Back" works in [employer].   Preventative: Flu - already received Tdap 01/2012 PPD 01/2012 negative Seat belt use discussed Sunscreen use discussed. No changing moles on skin Non smoker Alcohol - 3 beers a week  Caffeine: 1 cup coffee/day  Lives with wife Electrical engineer), no pets  Occupation: Firefighter Edu: [employer] education  Activity: basketball, running, disc golf etc Diet: water, fruits/vegetables daily, red meat 3-4x/wk, fish 1-2x/wk   Relevant past medical, surgical, family and social history reviewed and updated as indicated. Interim medical history since our last visit reviewed. Allergies and medications reviewed and updated. Outpatient Medications Prior to Visit  Medication Sig Dispense Refill  . doxycycline (VIBRA-TABS) 100 MG tablet Take 1 tablet (100 mg total) by mouth 2 (two) times daily. 14 tablet 0   No facility-administered medications prior to visit.      Per HPI unless specifically indicated in ROS section below Review of Systems  Constitutional: Negative for activity change, appetite change, chills, fatigue, fever and unexpected weight change.  HENT: Negative for hearing loss.   Eyes: Negative for visual disturbance.  Respiratory: Negative for cough, chest tightness, shortness of breath and wheezing.   Cardiovascular: Negative for chest pain, palpitations and leg swelling.  Gastrointestinal: Negative for abdominal distention, abdominal pain, blood in stool, constipation, diarrhea, nausea  and vomiting.  Genitourinary: Negative for difficulty urinating and hematuria.  Musculoskeletal: Negative for arthralgias, myalgias and neck pain.  Skin: Negative for rash.  Neurological: Negative for dizziness, seizures, syncope and headaches.  Hematological: Negative for adenopathy. Does not bruise/bleed easily.  Psychiatric/Behavioral: Negative for dysphoric mood. The patient is not nervous/anxious.        Objective:    BP 120/80 (BP Location: Left Arm, Patient Position: Sitting, Cuff Size: Normal)   Pulse 63   Temp 98.1 F (36.7 C) (Oral)   Ht 5' 10.75" (1.797 m)   Wt 203 lb (92.1 kg)   SpO2 96%   BMI 28.51 kg/m   Wt Readings from Last 3 Encounters:  02/04/17 203 lb (92.1 kg)  01/07/17 212 lb (96.2 kg)  02/22/15 202 lb (91.6 kg)    Physical Exam  Constitutional: He is oriented to person, place, and time. He appears well-developed and well-nourished. No distress.  HENT:  Head: Normocephalic and atraumatic.  Right Ear: Hearing, tympanic membrane, external ear and ear canal normal.  Left Ear: Hearing, tympanic membrane, external ear and ear canal normal.  Nose: Nose normal.  Mouth/Throat: Uvula is midline, oropharynx is clear and moist and mucous membranes are normal. No oropharyngeal exudate, posterior oropharyngeal edema or posterior oropharyngeal erythema.  Eyes: Conjunctivae and EOM are normal. Pupils are equal, round, and reactive to light. No scleral icterus.  Neck: Normal range of motion. Neck supple. No thyromegaly present.  Cardiovascular: Normal rate, regular rhythm, normal heart sounds and intact distal pulses.  No murmur heard. Pulses:      Radial  pulses are 2+ on the right side, and 2+ on the left side.  Pulmonary/Chest: Effort normal and breath sounds normal. No respiratory distress. He has no wheezes. He has no rales.  Abdominal: Soft. Bowel sounds are normal. He exhibits no distension and no mass. There is no tenderness. There is no rebound and no guarding.    Musculoskeletal: Normal range of motion. He exhibits no edema.  Lymphadenopathy:    He has no cervical adenopathy.  Neurological: He is alert and oriented to person, place, and time.  CN grossly intact, station and gait intact  Skin: Skin is warm and dry. No rash noted.  Psychiatric: He has a normal mood and affect. His behavior is normal. Judgment and thought content normal.  Nursing note and vitals reviewed.  Results for orders placed or performed in visit on 02/16/15  Basic metabolic panel  Result Value Ref Range   Sodium 140 135 - 146 mmol/L   Potassium 4.5 3.5 - 5.3 mmol/L   Chloride 103 98 - 110 mmol/L   CO2 27 20 - 31 mmol/L   Glucose, Bld 91 65 - 99 mg/dL   BUN 18 7 - 25 mg/dL   Creat 0.980.94 1.190.60 - 1.471.35 mg/dL   Calcium 9.9 8.6 - 82.910.3 mg/dL  Lipid panel  Result Value Ref Range   Cholesterol 173 125 - 200 mg/dL   Triglycerides 94 <562<150 mg/dL   HDL 46 >=13>=40 mg/dL   Total CHOL/HDL Ratio 3.8 <=5.0 Ratio   VLDL 19 <30 mg/dL   LDL Cholesterol 086108 <578<130 mg/dL      Assessment & Plan:   Problem List Items Addressed This Visit    Health care maintenance - Primary    Preventative protocols reviewed and updated unless pt declined. Discussed healthy diet and lifestyle.           Follow up plan: Return in about 2 years (around 02/05/2019) for annual exam, prior fasting for blood work.  Eric BoydenJavier Amiliana Foutz, MD

## 2017-02-04 NOTE — Patient Instructions (Signed)
Return as needed or in 2 years for next physical. You are doing well today. Good to see you today.  Health Maintenance, Male A healthy lifestyle and preventive care is important for your health and wellness. Ask your health care provider about what schedule of regular examinations is right for you. What should I know about weight and diet? Eat a Healthy Diet  Eat plenty of vegetables, fruits, whole grains, low-fat dairy products, and lean protein.  Do not eat a lot of foods high in solid fats, added sugars, or salt.  Maintain a Healthy Weight Regular exercise can help you achieve or maintain a healthy weight. You should:  Do at least 150 minutes of exercise each week. The exercise should increase your heart rate and make you sweat (moderate-intensity exercise).  Do strength-training exercises at least twice a week.  Watch Your Levels of Cholesterol and Blood Lipids  Have your blood tested for lipids and cholesterol every 5 years starting at 29 years of age. If you are at high risk for heart disease, you should start having your blood tested when you are 29 years old. You may need to have your cholesterol levels checked more often if: ? Your lipid or cholesterol levels are high. ? You are older than 29 years of age. ? You are at high risk for heart disease.  What should I know about cancer screening? Many types of cancers can be detected early and may often be prevented. Lung Cancer  You should be screened every year for lung cancer if: ? You are a current smoker who has smoked for at least 30 years. ? You are a former smoker who has quit within the past 15 years.  Talk to your health care provider about your screening options, when you should start screening, and how often you should be screened.  Colorectal Cancer  Routine colorectal cancer screening usually begins at 29 years of age and should be repeated every 5-10 years until you are 29 years old. You may need to be screened  more often if early forms of precancerous polyps or small growths are found. Your health care provider may recommend screening at an earlier age if you have risk factors for colon cancer.  Your health care provider may recommend using home test kits to check for hidden blood in the stool.  A small camera at the end of a tube can be used to examine your colon (sigmoidoscopy or colonoscopy). This checks for the earliest forms of colorectal cancer.  Prostate and Testicular Cancer  Depending on your age and overall health, your health care provider may do certain tests to screen for prostate and testicular cancer.  Talk to your health care provider about any symptoms or concerns you have about testicular or prostate cancer.  Skin Cancer  Check your skin from head to toe regularly.  Tell your health care provider about any new moles or changes in moles, especially if: ? There is a change in a mole's size, shape, or color. ? You have a mole that is larger than a pencil eraser.  Always use sunscreen. Apply sunscreen liberally and repeat throughout the day.  Protect yourself by wearing long sleeves, pants, a wide-brimmed hat, and sunglasses when outside.  What should I know about heart disease, diabetes, and high blood pressure?  If you are 3418-29 years of age, have your blood pressure checked every 3-5 years. If you are 29 years of age or older, have your blood pressure checked  every year. You should have your blood pressure measured twice-once when you are at a hospital or clinic, and once when you are not at a hospital or clinic. Record the average of the two measurements. To check your blood pressure when you are not at a hospital or clinic, you can use: ? An automated blood pressure machine at a pharmacy. ? A home blood pressure monitor.  Talk to your health care provider about your target blood pressure.  If you are between 24-38 years old, ask your health care provider if you should  take aspirin to prevent heart disease.  Have regular diabetes screenings by checking your fasting blood sugar level. ? If you are at a normal weight and have a low risk for diabetes, have this test once every three years after the age of 57. ? If you are overweight and have a high risk for diabetes, consider being tested at a younger age or more often.  A one-time screening for abdominal aortic aneurysm (AAA) by ultrasound is recommended for men aged 3-75 years who are current or former smokers. What should I know about preventing infection? Hepatitis B If you have a higher risk for hepatitis B, you should be screened for this virus. Talk with your health care provider to find out if you are at risk for hepatitis B infection. Hepatitis C Blood testing is recommended for:  Everyone born from [birth year] through 1965.  Anyone with known risk factors for hepatitis C.  Sexually Transmitted Diseases (STDs)  You should be screened each year for STDs including gonorrhea and chlamydia if: ? You are sexually active and are younger than 29 years of age. ? You are older than 29 years of age and your health care provider tells you that you are at risk for this type of infection. ? Your sexual activity has changed since you were last screened and you are at an increased risk for chlamydia or gonorrhea. Ask your health care provider if you are at risk.  Talk with your health care provider about whether you are at high risk of being infected with HIV. Your health care provider may recommend a prescription medicine to help prevent HIV infection.  What else can I do?  Schedule regular health, dental, and eye exams.  Stay current with your vaccines (immunizations).  Do not use any tobacco products, such as cigarettes, chewing tobacco, and e-cigarettes. If you need help quitting, ask your health care provider.  Limit alcohol intake to no more than 2 drinks per day. One drink equals 12 ounces of beer, 5  ounces of wine, or 1 ounces of hard liquor.  Do not use street drugs.  Do not share needles.  Ask your health care provider for help if you need support or information about quitting drugs.  Tell your health care provider if you often feel depressed.  Tell your health care provider if you have ever been abused or do not feel safe at home. This information is not intended to replace advice given to you by your health care provider. Make sure you discuss any questions you have with your health care provider. Document Released: 06/28/2007 Document Revised: 08/29/2015 Document Reviewed: 10/03/2014 Elsevier Interactive Patient Education  [employer].

## 2019-03-17 ENCOUNTER — Ambulatory Visit: Payer: BC Managed Care – PPO | Attending: Internal Medicine

## 2019-03-17 DIAGNOSIS — Z23 Encounter for immunization: Secondary | ICD-10-CM | POA: Insufficient documentation

## 2019-03-17 NOTE — Progress Notes (Signed)
   Covid-19 Vaccination Clinic  Name:  Neko Boyajian Leinberger    MRN: [id number] DOB: 1990-05-21  03/17/2019  Mr. Whiteford was observed post Covid-19 immunization for 15 minutes without incident. He was provided with Vaccine Information Sheet and instruction to access the V-Safe system.   Mr. Labonte was instructed to call 911 with any severe reactions post vaccine: Marland Kitchen Difficulty breathing  . Swelling of face and throat  . A fast heartbeat  . A bad rash all over body  . Dizziness and weakness   Immunizations Administered    Name Date Dose VIS Date Route   Pfizer COVID-19 Vaccine 03/17/2019  8:22 AM 0.3 mL 12/24/2018 Intramuscular   Manufacturer: [employer], [employer]   Lot: [id number]   NDC: [address]-0

## 2019-04-06 ENCOUNTER — Ambulatory Visit: Payer: BC Managed Care – PPO | Attending: Internal Medicine

## 2019-04-06 DIAGNOSIS — Z23 Encounter for immunization: Secondary | ICD-10-CM

## 2019-04-06 NOTE — Progress Notes (Signed)
   Covid-19 Vaccination Clinic  Name:  Eric Morse    MRN: [id number] DOB: 11-06-1990  04/06/2019  [name] was observed post Covid-19 immunization for 15 minutes without incident. He was provided with Vaccine Information Sheet and instruction to access the V-Safe system.   [name] was instructed to call 911 with any severe reactions post vaccine: Marland Kitchen Difficulty breathing  . Swelling of face and throat  . A fast heartbeat  . A bad rash all over body  . Dizziness and weakness   Immunizations Administered    Name Date Dose VIS Date Route   Pfizer COVID-19 Vaccine 04/06/2019  8:10 AM 0.3 mL 12/24/2018 Intramuscular   Manufacturer: [employer], [employer]   Lot: [id number]   NDC: [address]-7

## 2019-04-07 ENCOUNTER — Ambulatory Visit: Payer: BC Managed Care – PPO

## 2019-11-12 ENCOUNTER — Ambulatory Visit (INDEPENDENT_AMBULATORY_CARE_PROVIDER_SITE_OTHER): Payer: BLUE CROSS/BLUE SHIELD

## 2019-11-12 DIAGNOSIS — Z23 Encounter for immunization: Secondary | ICD-10-CM

## 2020-01-08 ENCOUNTER — Other Ambulatory Visit: Payer: Self-pay | Admitting: Family Medicine

## 2020-01-08 DIAGNOSIS — Z13220 Encounter for screening for lipoid disorders: Secondary | ICD-10-CM

## 2020-01-08 DIAGNOSIS — Z131 Encounter for screening for diabetes mellitus: Secondary | ICD-10-CM

## 2020-01-09 ENCOUNTER — Other Ambulatory Visit: Payer: BC Managed Care – PPO

## 2020-01-09 ENCOUNTER — Telehealth: Payer: Self-pay

## 2020-01-09 NOTE — Telephone Encounter (Signed)
[facility] Night - Client Nonclinical Telephone Record AccessNurse Client [facility] Night - Client Client Site [facility] [place] - Night Physician Eustaquio Boyden - MD Contact Type Call Who Is Calling Patient / Member / Family / Caregiver Caller Name [name] Caller Phone Number [phone] Patient Name Eric Morse Patient DOB July 08, 1990 Call Type Message Only Information Provided Reason for Call Request to [facility] Appointment Initial Comment Caller asks to cancel his appt for labs tomorrow at 820 due to son needing doctor appt tomorrow. Additional Comment Office hours provided. Declined triage. Advised to call back to reschedule. Disp. Time Disposition Final User 01/08/2020 7:22:48 PM General Information Provided Yes Gokounous, Erin Call Closed By: Tomie China Transaction Date/Time: 01/08/2020 7:17:52 PM (ET)

## 2020-01-18 ENCOUNTER — Encounter: Payer: BC Managed Care – PPO | Admitting: Family Medicine

## 2020-03-18 ENCOUNTER — Other Ambulatory Visit: Payer: Self-pay | Admitting: Family Medicine

## 2020-03-18 DIAGNOSIS — Z13220 Encounter for screening for lipoid disorders: Secondary | ICD-10-CM

## 2020-03-18 DIAGNOSIS — Z131 Encounter for screening for diabetes mellitus: Secondary | ICD-10-CM

## 2020-03-20 ENCOUNTER — Other Ambulatory Visit: Payer: Self-pay

## 2020-03-27 ENCOUNTER — Encounter: Payer: Self-pay | Admitting: Family Medicine

## 2020-05-11 ENCOUNTER — Other Ambulatory Visit: Payer: Self-pay

## 2020-05-15 ENCOUNTER — Telehealth: Payer: Self-pay

## 2020-05-15 NOTE — Telephone Encounter (Signed)
error 

## 2020-05-16 ENCOUNTER — Other Ambulatory Visit: Payer: Self-pay

## 2020-05-16 ENCOUNTER — Other Ambulatory Visit (INDEPENDENT_AMBULATORY_CARE_PROVIDER_SITE_OTHER): Payer: BC Managed Care – PPO

## 2020-05-16 DIAGNOSIS — Z131 Encounter for screening for diabetes mellitus: Secondary | ICD-10-CM | POA: Diagnosis not present

## 2020-05-16 DIAGNOSIS — Z13220 Encounter for screening for lipoid disorders: Secondary | ICD-10-CM | POA: Diagnosis not present

## 2020-05-16 LAB — BASIC METABOLIC PANEL
BUN: 13 mg/dL (ref 6–23)
CO2: 31 mEq/L (ref 19–32)
Calcium: 9.7 mg/dL (ref 8.4–10.5)
Chloride: 101 mEq/L (ref 96–112)
Creatinine, Ser: 0.92 mg/dL (ref 0.40–1.50)
GFR: 110.56 mL/min (ref >60.00)
Glucose, Bld: 113 mg/dL — ABNORMAL HIGH (ref 70–99)
Potassium: 4.1 mEq/L (ref 3.5–5.1)
Sodium: 139 mEq/L (ref 135–145)

## 2020-05-16 LAB — LIPID PANEL
Cholesterol: 152 mg/dL (ref 0–200)
HDL: 37.4 mg/dL — ABNORMAL LOW (ref >39.00)
LDL Cholesterol: 90 mg/dL (ref 0–99)
NonHDL: 115.02
Total CHOL/HDL Ratio: 4
Triglycerides: 127 mg/dL (ref 0.0–149.0)
VLDL: 25.4 mg/dL (ref 0.0–40.0)

## 2020-05-18 ENCOUNTER — Other Ambulatory Visit: Payer: Self-pay

## 2020-05-18 ENCOUNTER — Encounter: Payer: Self-pay | Admitting: Family Medicine

## 2020-05-18 ENCOUNTER — Ambulatory Visit (INDEPENDENT_AMBULATORY_CARE_PROVIDER_SITE_OTHER): Payer: BC Managed Care – PPO | Admitting: Family Medicine

## 2020-05-18 VITALS — BP 118/68 | HR 59 | Temp 98.2°F | Ht 71.0 in | Wt 193.1 lb

## 2020-05-18 DIAGNOSIS — Z0000 Encounter for general adult medical examination without abnormal findings: Secondary | ICD-10-CM

## 2020-05-18 DIAGNOSIS — R229 Localized swelling, mass and lump, unspecified: Secondary | ICD-10-CM

## 2020-05-18 MED ORDER — MULTIVITAMIN ADULT PO TABS: 1.0000 | ORAL_TABLET | Freq: Every day | ORAL | Status: DC

## 2020-05-18 NOTE — Telephone Encounter (Signed)
Updated pt's chart.  

## 2020-05-18 NOTE — Assessment & Plan Note (Addendum)
Preventative protocols reviewed and updated unless pt declined. Discussed healthy diet and lifestyle.  Non fasting for recent labs

## 2020-05-18 NOTE — Progress Notes (Signed)
Patient ID: Eric Morse, male    DOB: Aug 08, 1990, 30 y.o.   MRN: [id number]  This visit was conducted in person.  BP 118/68   Pulse (!) 59   Temp 98.2 F (36.8 C) (Temporal)   Ht 5\' 11"  (1.803 m)   Wt 193 lb 2 oz (87.6 kg)   SpO2 98%   BMI 26.94 kg/m    No exam data present  CC: CPE  Subjective:   HPI: Eric Morse is a 30 y.o. male presenting on 05/18/2020 for Annual Exam   2yo son dx Leukemia at age 71mo, just received BMT 04/08/2020 sees [facility]. Initial CAR-T cell therapy.   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 No changing moles on skin.  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 Interim medical history since our last visit reviewed. Allergies and medications reviewed and updated. No outpatient medications prior to visit.   No facility-administered medications prior to visit.    Family History  Problem Relation Age of Onset  . Stroke Maternal Grandfather 60       multiple  . Leukemia Son 1       dx age 66mo  . Coronary artery disease Neg Hx   . Diabetes Neg Hx   . Cancer Neg Hx   . Hypertension Neg Hx   . Hyperlipidemia Neg Hx     Per HPI unless specifically indicated in ROS section below Review of Systems  Constitutional: Negative for activity change, appetite change, chills, fatigue, fever and unexpected weight change.  HENT: Negative for  hearing loss.   Eyes: Negative for visual disturbance.  Respiratory: Negative for cough, chest tightness, shortness of breath and wheezing.   Cardiovascular: Negative for chest pain, palpitations and leg swelling.  Gastrointestinal: Negative for abdominal distention, abdominal pain, blood in stool, constipation, diarrhea, nausea and vomiting.  Genitourinary: Negative for difficulty urinating and hematuria.  Musculoskeletal: Negative for arthralgias, myalgias and neck pain.  Skin: Negative for rash.  Neurological: Negative for dizziness, seizures, syncope and headaches.  Hematological: Negative for adenopathy. Does not bruise/bleed easily.  Psychiatric/Behavioral: Negative for dysphoric mood. The patient is not nervous/anxious.    Objective:  BP 118/68   Pulse (!) 59   Temp 98.2 F (36.8 C) (Temporal)   Ht 5\' 11"  (1.803 m)   Wt 193 lb 2 oz (87.6 kg)   SpO2 98%   BMI 26.94 kg/m   Wt Readings from Last 3 Encounters:  05/18/20 193 lb 2 oz (87.6 kg)  02/04/17 203 lb (92.1 kg)  01/07/17 212 lb (96.2 kg)      Physical Exam Vitals and nursing note reviewed.  Constitutional:      General: He is not in acute distress.    Appearance: Normal appearance. He is well-developed. He is not ill-appearing.  HENT:     Head: Normocephalic and atraumatic.     Right  Ear: Hearing, tympanic membrane, ear canal and external ear normal.     Left Ear: Hearing, tympanic membrane, ear canal and external ear normal.  Eyes:     General: No scleral icterus.    Extraocular Movements: Extraocular movements intact.     Conjunctiva/sclera: Conjunctivae normal.     Pupils: Pupils are equal, round, and reactive to light.  Neck:     Thyroid: No thyroid mass or thyromegaly.  Cardiovascular:     Rate and Rhythm: Normal rate and regular rhythm.     Pulses: Normal pulses.          Radial pulses are 2+ on the right side and 2+ on the left side.     Heart sounds: Normal heart sounds. No murmur  heard.   Pulmonary:     Effort: Pulmonary effort is normal. No respiratory distress.     Breath sounds: Normal breath sounds. No wheezing, rhonchi or rales.  Abdominal:     General: Abdomen is flat. Bowel sounds are normal. There is no distension.     Palpations: Abdomen is soft. There is no mass.     Tenderness: There is no abdominal tenderness. There is no guarding or rebound.     Hernia: No hernia is present.  Musculoskeletal:        General: Normal range of motion.     Cervical back: Normal range of motion and neck supple.     Right lower leg: No edema.     Left lower leg: No edema.  Lymphadenopathy:     Cervical: No cervical adenopathy.  Skin:    General: Skin is warm and dry.     Findings: No rash.          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     Mental Status: He is alert and oriented to person, place, and time.     Comments: CN grossly intact, station and gait intact  Psychiatric:        Mood and Affect: Mood normal.        Behavior: Behavior normal.        Thought Content: Thought content normal.        Judgment: Judgment normal.       Results for orders placed or performed in visit on 05/16/20  Basic metabolic panel  Result Value Ref Range   Sodium 139 135 - 145 mEq/L   Potassium 4.1 3.5 - 5.1 mEq/L   Chloride 101 96 - 112 mEq/L   CO2 31 19 - 32 mEq/L   Glucose, Bld 113 (H) 70 - 99 mg/dL   BUN 13 6 - 23 mg/dL   Creatinine, Ser 4.00 0.40 - 1.50 mg/dL   GFR 867.61 >95.09 mL/min   Calcium 9.7 8.4 - 10.5 mg/dL  Lipid panel  Result Value Ref Range   Cholesterol 152 0 - 200 mg/dL   Triglycerides 326.7 0.0 - 149.0 mg/dL   HDL 12.45 (L) >80.99 mg/dL   VLDL 83.3 0.0 - 82.5 mg/dL   LDL Cholesterol 90 0 - 99 mg/dL   Total CHOL/HDL Ratio 4    NonHDL 115.02    Assessment & Plan:  This visit occurred during the SARS-CoV-2 public health  emergency.  Safety protocols were in place, including screening questions prior to the visit, additional usage of staff PPE, and extensive cleaning of exam room while observing appropriate contact  time as indicated for disinfecting solutions.   Problem List Items Addressed This Visit    Health care maintenance - Primary    Preventative protocols reviewed and updated unless pt declined. Discussed healthy diet and lifestyle.  Non fasting for recent labs       Localized skin mass, lump, or swelling    Longstanding, unchanged. Anticipate hemangioma/lipoma.  Desires definitive treatment. Will check soft tissue US and likely refer to gen surg.       Relevant Orders   US SOFT TISSUE LOWER EXTREMITY LIMITED RIGHT (NON-VASCULAR)       Meds ordered this encounter  Medications  . Multiple Vitamin (MULTIVITAMIN ADULT) TABS    Sig: Take 1 tablet by mouth daily.   Orders Placed This Encounter  Procedures  . US SOFT TISSUE LOWER EXTREMITY LIMITED RIGHT (NON-VASCULAR)    Standing Status:   Future    Standing Expiration Date:   05/18/2021    Order Specific Question:   Reason for Exam (SYMPTOM  OR DIAGNOSIS REQUIRED)    Answer:   R thigh mass, chronic    Order Specific Question:   Preferred imaging location?    Answer:   [facility]    Patient instructions: Eye exam today  Good to see you today You are doing well today.  Return as needed or in 2-3 years for next physical.   Follow up plan: Return in about 2 years (around 05/19/2022), or if symptoms worsen or fail to improve, for annual exam, prior fasting for blood work.  Eustaquio Boyden, MD

## 2020-05-18 NOTE — Assessment & Plan Note (Signed)
Longstanding, unchanged. Anticipate hemangioma/lipoma.  Desires definitive treatment. Will check soft tissue US and likely refer to gen surg.

## 2020-05-18 NOTE — Patient Instructions (Addendum)
Good to see you today You are doing well today.  Return as needed or in 2-3 years for next physical.   Health Maintenance, Male Adopting a healthy lifestyle and getting preventive care are important in promoting health and wellness. Ask your health care provider about:  The right schedule for you to have regular tests and exams.  Things you can do on your own to prevent diseases and keep yourself healthy. What should I know about diet, weight, and exercise? Eat a healthy diet  Eat a diet that includes plenty of vegetables, fruits, low-fat dairy products, and lean protein.  Do not eat a lot of foods that are high in solid fats, added sugars, or sodium.   Maintain a healthy weight Body mass index (BMI) is a measurement that can be used to identify possible weight problems. It estimates body fat based on height and weight. Your health care provider can help determine your BMI and help you achieve or maintain a healthy weight. Get regular exercise Get regular exercise. This is one of the most important things you can do for your health. Most adults should:  Exercise for at least 150 minutes each week. The exercise should increase your heart rate and make you sweat (moderate-intensity exercise).  Do strengthening exercises at least twice a week. This is in addition to the moderate-intensity exercise.  Spend less time sitting. Even light physical activity can be beneficial. Watch cholesterol and blood lipids Have your blood tested for lipids and cholesterol at 33 years of age, then have this test every 5 years. You may need to have your cholesterol levels checked more often if:  Your lipid or cholesterol levels are high.  You are older than 32 years of age.  You are at high risk for heart disease. What should I know about cancer screening? Many types of cancers can be detected early and may often be prevented. Depending on your health history and family history, you may need to have  cancer screening at various ages. This may include screening for:  Colorectal cancer.  Prostate cancer.  Skin cancer.  Lung cancer. What should I know about heart disease, diabetes, and high blood pressure? Blood pressure and heart disease  High blood pressure causes heart disease and increases the risk of stroke. This is more likely to develop in people who have high blood pressure readings, are of African descent, or are overweight.  Talk with your health care provider about your target blood pressure readings.  Have your blood pressure checked: ? Every 3-5 years if you are 59-43 years of age. ? Every year if you are 55 years old or older.  If you are between the ages of 56 and 66 and are a current or former smoker, ask your health care provider if you should have a one-time screening for abdominal aortic aneurysm (AAA). Diabetes Have regular diabetes screenings. This checks your fasting blood sugar level. Have the screening done:  Once every three years after age 38 if you are at a normal weight and have a low risk for diabetes.  More often and at a younger age if you are overweight or have a high risk for diabetes. What should I know about preventing infection? Hepatitis B If you have a higher risk for hepatitis B, you should be screened for this virus. Talk with your health care provider to find out if you are at risk for hepatitis B infection. Hepatitis C Blood testing is recommended for:  Everyone born  from 1945 through 1965.  Anyone with known risk factors for hepatitis C. Sexually transmitted infections (STIs)  You should be screened each year for STIs, including gonorrhea and chlamydia, if: ? You are sexually active and are younger than 32 years of age. ? You are older than 32 years of age and your health care provider tells you that you are at risk for this type of infection. ? Your sexual activity has changed since you were last screened, and you are at increased  risk for chlamydia or gonorrhea. Ask your health care provider if you are at risk.  Ask your health care provider about whether you are at high risk for HIV. Your health care provider may recommend a prescription medicine to help prevent HIV infection. If you choose to take medicine to prevent HIV, you should first get tested for HIV. You should then be tested every 3 months for as long as you are taking the medicine. Follow these instructions at home: Lifestyle  Do not use any products that contain nicotine or tobacco, such as cigarettes, e-cigarettes, and chewing tobacco. If you need help quitting, ask your health care provider.  Do not use street drugs.  Do not share needles.  Ask your health care provider for help if you need support or information about quitting drugs. Alcohol use  Do not drink alcohol if your health care provider tells you not to drink.  If you drink alcohol: ? Limit how much you have to 0-2 drinks a day. ? Be aware of how much alcohol is in your drink. In the [place] one drink equals one 12 oz bottle of beer (355 mL), one 5 oz glass of wine (148 mL), or one 1 oz glass of hard liquor (44 mL). General instructions  Schedule regular health, dental, and eye exams.  Stay current with your vaccines.  Tell your health care provider if: ? You often feel depressed. ? You have ever been abused or do not feel safe at home. Summary  Adopting a healthy lifestyle and getting preventive care are important in promoting health and wellness.  Follow your health care provider's instructions about healthy diet, exercising, and getting tested or screened for diseases.  Follow your health care provider's instructions on monitoring your cholesterol and blood pressure. This information is not intended to replace advice given to you by your health care provider. Make sure you discuss any questions you have with your health care provider. Document Revised: 12/23/2017 Document  Reviewed: 12/23/2017 Elsevier Patient Education  2021 [employer].

## 2020-05-30 ENCOUNTER — Other Ambulatory Visit: Payer: Self-pay

## 2020-05-30 ENCOUNTER — Ambulatory Visit
Admission: RE | Admit: 2020-05-30 | Discharge: 2020-05-30 | Disposition: A | Discharge status: Home / Self Care | Payer: BC Managed Care – PPO | Source: Ambulatory Visit | Attending: Family Medicine | Admitting: Family Medicine

## 2020-05-30 DIAGNOSIS — R229 Localized swelling, mass and lump, unspecified: Secondary | ICD-10-CM | POA: Diagnosis not present

## 2020-06-02 ENCOUNTER — Encounter: Payer: Self-pay | Admitting: Family Medicine

## 2020-06-02 ENCOUNTER — Other Ambulatory Visit: Payer: Self-pay | Admitting: Family Medicine

## 2020-06-02 DIAGNOSIS — R229 Localized swelling, mass and lump, unspecified: Secondary | ICD-10-CM

## 2020-06-04 NOTE — Telephone Encounter (Signed)
Referral has been placed. 

## 2020-06-07 ENCOUNTER — Ambulatory Visit: Payer: Self-pay | Admitting: Surgery

## 2020-06-07 ENCOUNTER — Ambulatory Visit (INDEPENDENT_AMBULATORY_CARE_PROVIDER_SITE_OTHER): Payer: Self-pay | Admitting: Surgery

## 2020-06-07 ENCOUNTER — Encounter: Payer: Self-pay | Admitting: Surgery

## 2020-06-07 ENCOUNTER — Telehealth: Payer: Self-pay | Admitting: Surgery

## 2020-06-07 ENCOUNTER — Other Ambulatory Visit: Payer: Self-pay

## 2020-06-07 VITALS — BP 109/74 | HR 57 | Temp 98.3°F | Ht 71.0 in | Wt 194.8 lb

## 2020-06-07 DIAGNOSIS — R229 Localized swelling, mass and lump, unspecified: Secondary | ICD-10-CM

## 2020-06-07 NOTE — Progress Notes (Signed)
Patient ID: Eric Morse, male   DOB: 02/05/1990, 30 y.o.   MRN: [id number]  Chief Complaint: Right anterior thigh mass  History of Present Illness Eric Morse is a 30 y.o. male with a known mass that first presented about 15 years ago.  No change in size, color or behavior.  Painless.  Reports has tolerated contact football and multiple other activities without drainage, hematoma formation, ecchymosis or skin color changes. Some recent familial cancer diagnosis raises some concern for this lesion.  Past Medical History Past Medical History:  Diagnosis Date  . No pertinent past medical history       Past Surgical History:  Procedure Laterality Date  . NO PAST SURGERIES      No Known Allergies  Current Outpatient Medications  Medication Sig Dispense Refill  . Multiple Vitamin (MULTIVITAMIN ADULT) TABS Take 1 tablet by mouth daily.     No current facility-administered medications for this visit.    Family History Family History  Problem Relation Age of Onset  . Stroke Maternal Grandfather 60       multiple  . Leukemia Son 1       dx age 8mo  . Coronary artery disease Neg Hx   . Diabetes Neg Hx   . Cancer Neg Hx   . Hypertension Neg Hx   . Hyperlipidemia Neg Hx       Social History Social History   Tobacco Use  . Smoking status: Never Smoker  . Smokeless tobacco: Never Used  Vaping Use  . Vaping Use: Never used  Substance Use Topics  . Alcohol use: Yes    Comment: once weekly, 3-4 beers at a time, no binging  . Drug use: No        Review of Systems  Constitutional: Negative.   HENT: Negative.   Eyes: Negative.   Respiratory: Negative.   Cardiovascular: Negative.   Gastrointestinal: Negative.   Genitourinary: Negative.   Skin: Negative.   Neurological: Negative.   Psychiatric/Behavioral: Negative.       Physical Exam Blood pressure 109/74, pulse (!) 57, temperature 98.3 F (36.8 C), height 5' 11" (1.803 m), weight 194 lb 12.8 oz (88.4  kg), SpO2 99 %. Last Weight  Most recent update: 06/07/2020 10:26 AM   Weight  88.4 kg (194 lb 12.8 oz)            CONSTITUTIONAL: Well developed, and nourished, appropriately responsive and aware without distress.   EYES: Sclera non-icteric.   EARS, NOSE, MOUTH AND THROAT: Mask worn.   Hearing is intact to voice.  NECK: Trachea is midline, and there is no jugular venous distension.  LYMPH NODES:  Lymph nodes in the neck are not enlarged. RESPIRATORY:  Lungs are clear, and breath sounds are equal bilaterally. Normal respiratory effort without pathologic use of accessory muscles. CARDIOVASCULAR: Heart is regular in rate and rhythm. GI: The abdomen is  soft, nontender, and nondistended. MUSCULOSKELETAL:  Symmetrical muscle tone appreciated in all four extremities.    SKIN: Skin turgor is normal. No pathologic skin lesions appreciated.  There is a raised mass of the proximal anterior right thigh, stretching the overlying skin with a sheen/shine to it, there is significant telangiectatic changes to the vasculature present there.  There is no evidence of punctum, but clearly this appears to be primarily of skin origin, suspect unlikely to be subcutaneous in nature. NEUROLOGIC:  Motor and sensation appear grossly normal.  Cranial nerves are grossly without defect. PSYCH:  Alert and   oriented to person, place and time. Affect is appropriate for situation.  Data Reviewed I have personally reviewed what is currently available of the patient's imaging, recent labs and medical records.   Labs:  No flowsheet data found. CMP Latest Ref Rng & Units 05/16/2020 02/16/2015  Glucose 70 - 99 mg/dL 751(Z) 91  BUN 6 - 23 mg/dL 13 18  Creatinine 0.01 - 1.50 mg/dL 7.49 4.49  Sodium 675 - 145 mEq/L 139 140  Potassium 3.5 - 5.1 mEq/L 4.1 4.5  Chloride 96 - 112 mEq/L 101 103  CO2 19 - 32 mEq/L 31 27  Calcium 8.4 - 10.5 mg/dL 9.7 9.9      Imaging: Radiology review:   CLINICAL DATA:  Right thigh mass. Mass  present for multiple years, nontender.  EXAM: ULTRASOUND RIGHT LOWER EXTREMITY LIMITED  TECHNIQUE: Ultrasound examination of the lower extremity soft tissues was performed in the area of clinical concern.  COMPARISON:  Correlation may with clinical photographs the patient's anterior thigh mass.  FINDINGS: Oval circumscribed heterogeneous mass is centered on the subcutaneous soft tissues, depressing the underlying anterior muscle fascia and bulging the overlying skin contour. Mass measures 4.1 x 2.6 x 4.4 cm. No internal blood flow detected on color Doppler analysis.  IMPRESSION: 1. Heterogeneous soft tissue mass along the anterior right thigh corresponds to the clinically apparent anterior thigh mass. Sonographically the mass measures 4.1 cm in greatest dimension. The mass could reflect a lipoma, but is nonspecific. Circumscribed margins support a nonaggressive mass. If desired clinically, the mass could be further characterized with MRI without and with contrast.   Electronically Signed   By: Amie Portland M.D.   On: 06/01/2020 12:11   Within last 24 hrs: No results found.  Assessment    Right right anterior thigh mass. Patient Active Problem List   Diagnosis Date Noted  . Localized skin mass, lump, or swelling 08/10/2012  . Health care maintenance 03/04/2011    Plan    We discussed various options in terms of proceeding with excision, we have elected to proceed with monitored anesthesia care and excision in the operating room with addition of local anesthetic. Risks and benefits of above procedure discussed in detail, risks include but are not included to anesthesia, bleeding, infection, recurrence, unanticipated pathology requiring additional procedures.  Face-to-face time spent with the patient and accompanying care providers(if present) was 20 minutes, with more than 50% of the time spent counseling, educating, and coordinating care of the patient.     These notes generated with voice recognition software. I apologize for typographical errors.  Campbell Lerner M.D., FACS 06/07/2020, 10:52 AM

## 2020-06-07 NOTE — H&P (View-Only) (Signed)
Patient ID: Eric Morse, male   DOB: 1990/06/25, 30 y.o.   MRN: [id number]  Chief Complaint: Right anterior thigh mass  History of Present Illness Eric Morse is a 30 y.o. male with a known mass that first presented about 15 years ago.  No change in size, color or behavior.  Painless.  Reports has tolerated contact football and multiple other activities without drainage, hematoma formation, ecchymosis or skin color changes. Some recent familial cancer diagnosis raises some concern for this lesion.  Past Medical History Past Medical History:  Diagnosis Date  . No pertinent past medical history       Past Surgical History:  Procedure Laterality Date  . NO PAST SURGERIES      No Known Allergies  Current Outpatient Medications  Medication Sig Dispense Refill  . Multiple Vitamin (MULTIVITAMIN ADULT) TABS Take 1 tablet by mouth daily.     No current facility-administered medications for this visit.    Family History Family History  Problem Relation Age of Onset  . Stroke Maternal Grandfather 60       multiple  . Leukemia Son 1       dx age 2mo  . Coronary artery disease Neg Hx   . Diabetes Neg Hx   . Cancer Neg Hx   . Hypertension Neg Hx   . Hyperlipidemia Neg Hx       Social History Social History   Tobacco Use  . Smoking status: Never Smoker  . Smokeless tobacco: Never Used  Vaping Use  . Vaping Use: Never used  Substance Use Topics  . Alcohol use: Yes    Comment: once weekly, 3-4 beers at a time, no binging  . Drug use: No        Review of Systems  Constitutional: Negative.   HENT: Negative.   Eyes: Negative.   Respiratory: Negative.   Cardiovascular: Negative.   Gastrointestinal: Negative.   Genitourinary: Negative.   Skin: Negative.   Neurological: Negative.   Psychiatric/Behavioral: Negative.       Physical Exam Blood pressure 109/74, pulse (!) 57, temperature 98.3 F (36.8 C), height 5\' 11"  (1.803 m), weight 194 lb 12.8 oz (88.4  kg), SpO2 99 %. Last Weight  Most recent update: 06/07/2020 10:26 AM   Weight  88.4 kg (194 lb 12.8 oz)            CONSTITUTIONAL: Well developed, and nourished, appropriately responsive and aware without distress.   EYES: Sclera non-icteric.   EARS, NOSE, MOUTH AND THROAT: Mask worn.   Hearing is intact to voice.  NECK: Trachea is midline, and there is no jugular venous distension.  LYMPH NODES:  Lymph nodes in the neck are not enlarged. RESPIRATORY:  Lungs are clear, and breath sounds are equal bilaterally. Normal respiratory effort without pathologic use of accessory muscles. CARDIOVASCULAR: Heart is regular in rate and rhythm. GI: The abdomen is  soft, nontender, and nondistended. MUSCULOSKELETAL:  Symmetrical muscle tone appreciated in all four extremities.    SKIN: Skin turgor is normal. No pathologic skin lesions appreciated.  There is a raised mass of the proximal anterior right thigh, stretching the overlying skin with a sheen/shine to it, there is significant telangiectatic changes to the vasculature present there.  There is no evidence of punctum, but clearly this appears to be primarily of skin origin, suspect unlikely to be subcutaneous in nature. NEUROLOGIC:  Motor and sensation appear grossly normal.  Cranial nerves are grossly without defect. PSYCH:  Alert and  oriented to person, place and time. Affect is appropriate for situation.  Data Reviewed I have personally reviewed what is currently available of the patient's imaging, recent labs and medical records.   Labs:  No flowsheet data found. CMP Latest Ref Rng & Units 05/16/2020 02/16/2015  Glucose 70 - 99 mg/dL 751(Z) 91  BUN 6 - 23 mg/dL 13 18  Creatinine 0.01 - 1.50 mg/dL 7.49 4.49  Sodium 675 - 145 mEq/L 139 140  Potassium 3.5 - 5.1 mEq/L 4.1 4.5  Chloride 96 - 112 mEq/L 101 103  CO2 19 - 32 mEq/L 31 27  Calcium 8.4 - 10.5 mg/dL 9.7 9.9      Imaging: Radiology review:   CLINICAL DATA:  Right thigh mass. Mass  present for multiple years, nontender.  EXAM: ULTRASOUND RIGHT LOWER EXTREMITY LIMITED  TECHNIQUE: Ultrasound examination of the lower extremity soft tissues was performed in the area of clinical concern.  COMPARISON:  Correlation may with clinical photographs the patient's anterior thigh mass.  FINDINGS: Oval circumscribed heterogeneous mass is centered on the subcutaneous soft tissues, depressing the underlying anterior muscle fascia and bulging the overlying skin contour. Mass measures 4.1 x 2.6 x 4.4 cm. No internal blood flow detected on color Doppler analysis.  IMPRESSION: 1. Heterogeneous soft tissue mass along the anterior right thigh corresponds to the clinically apparent anterior thigh mass. Sonographically the mass measures 4.1 cm in greatest dimension. The mass could reflect a lipoma, but is nonspecific. Circumscribed margins support a nonaggressive mass. If desired clinically, the mass could be further characterized with MRI without and with contrast.   Electronically Signed   By: Amie Portland M.D.   On: 06/01/2020 12:11   Within last 24 hrs: No results found.  Assessment    Right right anterior thigh mass. Patient Active Problem List   Diagnosis Date Noted  . Localized skin mass, lump, or swelling 08/10/2012  . Health care maintenance 03/04/2011    Plan    We discussed various options in terms of proceeding with excision, we have elected to proceed with monitored anesthesia care and excision in the operating room with addition of local anesthetic. Risks and benefits of above procedure discussed in detail, risks include but are not included to anesthesia, bleeding, infection, recurrence, unanticipated pathology requiring additional procedures.  Face-to-face time spent with the patient and accompanying care providers(if present) was 20 minutes, with more than 50% of the time spent counseling, educating, and coordinating care of the patient.     These notes generated with voice recognition software. I apologize for typographical errors.  Campbell Lerner M.D., FACS 06/07/2020, 10:52 AM

## 2020-06-07 NOTE — Telephone Encounter (Signed)
Outgoing call is made, left message for patient to call.  Please advise patient of Pre-Admission date/time, COVID Testing date and Surgery date.  Surgery Date: 07/04/20 Preadmission Testing Date: 06/25/20 (phone 1p-5p) Covid Testing Date: Not needed.     Also patient needs to call at [phone], between 1-3:00pm the day before surgery, to find out what time to arrive for surgery.

## 2020-06-07 NOTE — Patient Instructions (Addendum)
We will get you scheduled for surgery at [facility] with Dr Claudine Mouton. Please see your Blue surgery sheet for information. Our surgery scheduler will call you to look at surgery dates and to go over information.  Call us if you have any questions.   Epidermoid Cyst Removal Epidermoid cyst removal is a procedure to remove a fluid-filled sac that forms under your skin (epidermoid cyst). This type of cyst is filled with a thick, oily substance (keratin) that is secreted by your skin glands. Epidermoid cysts may also be called epidermal cysts, or keratin cysts. Normally, the skin secretes this pasty material through a gland or a hair follicle. However, when a skin gland or hair follicle becomes blocked, an epidermoid cyst can form. You may need this procedure if you have an epidermal cyst that becomes large, uncomfortable, or inflamed. Tell a health care provider about:  Any allergies you have.  All medicines you are taking, including vitamins, herbs, eye drops, creams, and over-the-counter medicines.  Any problems you or family members have had with anesthetic medicines.  Any blood disorders you have.  Any surgeries you have had.  Any medical conditions you have now or have had.  Whether you are pregnant or may be pregnant. What are the risks? Generally, this is a safe procedure. However, problems may occur, including:  Recurrence of the cyst.  Bleeding.  Infection.  Scarring. What happens before the procedure?  Ask your health care provider about: ? Changing or stopping your regular medicines. This is especially important if you are taking diabetes medicines or blood thinners. ? Taking medicines such as aspirin and ibuprofen. These medicines can thin your blood. Do not take these medicines unless your health care provider tells you to take them. ? Taking over-the-counter medicines, vitamins, herbs, and supplements.  If you have an inflamed or infected cyst, you may have to take  antibiotic medicine before the cyst removal. Take your antibiotic as told by your health care provider. Do not stop taking the antibiotic even if you start to feel better.  Take a shower on the morning of your procedure. Your health care provider may ask you to use a germ-killing soap. What happens during the procedure?  You will be given a medicine to numb the area (local anesthetic).  The skin around the cyst will be cleaned with a germ-killing solution.  The health care provider will make a small incision in your skin over the cyst.  The health care provider will separate the cyst from the surrounding tissues that are under your skin.  If possible, the cyst will be removed undamaged (intact).  If the cyst bursts (ruptures), it will be removed in pieces.  After the cyst is removed, the health care provider will control any bleeding and close the incision with small stitches (sutures). Small incisions may not need sutures, and the bleeding will be controlled by applying direct pressure with gauze.  The health care provider may apply antibiotic ointment and a bandage (dressing) over the incision. The procedure may vary among health care providers and hospitals.   What happens after the procedure?  If you are prescribed an antibiotic medicine or ointment, take or apply it as told by your health care provider. Do not stop using the antibiotic even if you start to feel better. Summary  Epidermoid cyst removal is a procedure to remove a sac that has formed under your skin.  You may need this procedure if you have an epidermoid cyst that becomes large,  uncomfortable, or inflamed.  The health care provider will make a small incision in your skin to remove the cyst.  If you are prescribed an antibiotic medicine before the procedure, after the procedure, or both, use the antibiotic as told by your health care provider. Do not stop using the antibiotic even if you start to feel better. This  information is not intended to replace advice given to you by your health care provider. Make sure you discuss any questions you have with your health care provider. Document Revised: 04/06/2019 Document Reviewed: 04/06/2019 Elsevier Patient Education  2021 [employer].

## 2020-06-12 NOTE — Telephone Encounter (Signed)
Patient returns call, he is aware of all information regarding his surgery and verbalized understanding.

## 2020-06-12 NOTE — Telephone Encounter (Signed)
Outgoing call is made, left another message for patient to call.

## 2020-06-25 ENCOUNTER — Other Ambulatory Visit: Payer: Self-pay

## 2020-06-25 ENCOUNTER — Other Ambulatory Visit
Admission: RE | Admit: 2020-06-25 | Discharge: 2020-06-25 | Disposition: A | Discharge status: Home / Self Care | Payer: BC Managed Care – PPO | Source: Ambulatory Visit | Attending: Surgery | Admitting: Surgery

## 2020-06-25 NOTE — Patient Instructions (Addendum)
Your procedure is scheduled on: Wednesday July 04, 2020. Report to Day Surgery inside [facility] 2nd floor (Stop by admissions desk first before you get on elevator). To find out your arrival time please call [phone] between 1PM - 3PM on Tuesday July 03, 2020.  Remember: Instructions that are not followed completely may result in serious medical risk,  up to and including death, or upon the discretion of your surgeon and anesthesiologist your  surgery may need to be rescheduled.     _X__ 1. Do not eat food or Drink Fluids after midnight the night before your procedure.                 No chewing gum or hard candies.   __X__2.  On the morning of surgery brush your teeth with toothpaste and water, you                may rinse your mouth with mouthwash if you wish.  Do not swallow any toothpaste of mouthwash.     _X__ 3.  No Alcohol for 24 hours before or after surgery.   _X__ 4.  Do Not Smoke or use e-cigarettes For 24 Hours Prior to Your Surgery.                 Do not use any chewable tobacco products for at least 6 hours prior to                 Surgery.  _X__  5.  Do not use any recreational drugs (marijuana, cocaine, heroin, ecstasy, MDMA or other)                For at least one week prior to your surgery.  Combination of these drugs with anesthesia                May have life threatening results.  __X__ 6.  Notify your doctor if there is any change in your medical condition      (cold, fever, infections).     Do not wear jewelry, make-up, hairpins, clips or nail polish. Do not wear lotions, powders, or perfumes.  Do not shave 48 hours prior to surgery.  Do not bring valuables to the hospital.    [facility] is not responsible for any belongings or valuables.  Contacts, dentures or bridgework may not be worn into surgery. Leave your suitcase in the car. After surgery it may be brought to your room. For patients admitted to the hospital,  discharge time is determined by your treatment team.   Patients discharged the day of surgery will not be allowed to drive home.   Make arrangements for someone to be with you for the first 24 hours of your Same Day Discharge.   __X__ Take these medicines the morning of surgery with A SIP OF WATER:    1. None   2.   3.   4.  5.  6.  ____ Fleet Enema (as directed)   __X__ Use CHG Soap (or wipes) as directed  ____ Use Benzoyl Peroxide Gel as instructed  ____ Use inhalers on the day of surgery  ____ Stop metformin 2 days prior to surgery    ____ Take 1/2 of usual insulin dose the night before surgery. No insulin the morning          of surgery.   ____ Call your PCP, cardiologist, or Pulmonologist if taking Coumadin/Plavix/aspirin and ask when to stop before your surgery.  __X__ One Week prior to surgery- Stop Anti-inflammatories such as Ibuprofen, Aleve, Advil, Motrin, meloxicam (MOBIC), diclofenac, etodolac, ketorolac, Toradol, Daypro, piroxicam, Goody's or BC powders. OK TO USE TYLENOL IF NEEDED   __X__ Stop supplements until after surgery.    ____ Bring C-Pap to the hospital.    If you have any questions regarding your pre-procedure instructions,  Please call Pre-admit Testing at [phone].

## 2020-06-25 NOTE — Pre-Procedure Instructions (Signed)
  Patient Covid test + 06/24/20

## 2020-06-26 ENCOUNTER — Encounter: Payer: Self-pay | Admitting: Family Medicine

## 2020-07-02 ENCOUNTER — Other Ambulatory Visit: Payer: Self-pay

## 2020-07-03 MED ORDER — GABAPENTIN 300 MG PO CAPS: 300.0000 mg | ORAL_CAPSULE | ORAL | Status: AC

## 2020-07-03 MED ORDER — CHLORHEXIDINE GLUCONATE 0.12 % MT SOLN: 15.0000 mL | Freq: Once | OROMUCOSAL | Status: AC

## 2020-07-03 MED ORDER — LACTATED RINGERS IV SOLN: INTRAVENOUS | Status: DC

## 2020-07-03 MED ORDER — CEFAZOLIN SODIUM-DEXTROSE 2-4 GM/100ML-% IV SOLN
2.0000 g | INTRAVENOUS | Status: AC
  Administered 2020-07-04: 2 g via INTRAVENOUS

## 2020-07-03 MED ORDER — CHLORHEXIDINE GLUCONATE CLOTH 2 % EX PADS
6.0000 | MEDICATED_PAD | Freq: Once | CUTANEOUS | Status: DC
Start: 2020-07-04 — End: 2020-07-04

## 2020-07-03 MED ORDER — CELECOXIB 200 MG PO CAPS: 200.0000 mg | ORAL_CAPSULE | ORAL | Status: AC

## 2020-07-03 MED ORDER — ACETAMINOPHEN 500 MG PO TABS: 1000.0000 mg | ORAL_TABLET | ORAL | Status: AC

## 2020-07-03 MED ORDER — BUPIVACAINE LIPOSOME 1.3 % IJ SUSP: 20.0000 mL | Freq: Once | INTRAMUSCULAR | Status: DC

## 2020-07-03 MED ORDER — ORAL CARE MOUTH RINSE: 15.0000 mL | Freq: Once | OROMUCOSAL | Status: AC

## 2020-07-03 MED ORDER — FAMOTIDINE 20 MG PO TABS: 20.0000 mg | ORAL_TABLET | Freq: Once | ORAL | Status: AC

## 2020-07-04 ENCOUNTER — Encounter
Admission: RE | Disposition: A | Discharge status: Home / Self Care | Payer: Self-pay | Source: Home / Self Care | Attending: Surgery

## 2020-07-04 ENCOUNTER — Encounter: Payer: Self-pay | Admitting: Surgery

## 2020-07-04 ENCOUNTER — Ambulatory Visit: Payer: BC Managed Care – PPO | Admitting: Certified Registered"

## 2020-07-04 ENCOUNTER — Ambulatory Visit
Admission: RE | Admit: 2020-07-04 | Discharge: 2020-07-04 | Disposition: A | Discharge status: Home / Self Care | Payer: BC Managed Care – PPO | Attending: Surgery | Admitting: Surgery

## 2020-07-04 ENCOUNTER — Other Ambulatory Visit: Payer: Self-pay

## 2020-07-04 ENCOUNTER — Telehealth: Payer: Self-pay | Admitting: Surgery

## 2020-07-04 DIAGNOSIS — L720 Epidermal cyst: Secondary | ICD-10-CM | POA: Diagnosis not present

## 2020-07-04 DIAGNOSIS — R229 Localized swelling, mass and lump, unspecified: Secondary | ICD-10-CM

## 2020-07-04 DIAGNOSIS — R2241 Localized swelling, mass and lump, right lower limb: Secondary | ICD-10-CM | POA: Diagnosis present

## 2020-07-04 HISTORY — PX: MASS EXCISION: SHX2000

## 2020-07-04 SURGERY — EXCISION MASS
Anesthesia: Monitor Anesthesia Care | Laterality: Right

## 2020-07-04 MED ORDER — BUPIVACAINE-EPINEPHRINE (PF) 0.25% -1:200000 IJ SOLN
INTRAMUSCULAR | Status: AC
  Filled 2020-07-04: qty 30

## 2020-07-04 MED ORDER — FENTANYL CITRATE (PF) 100 MCG/2ML IJ SOLN: 25.0000 ug | INTRAMUSCULAR | Status: DC | PRN

## 2020-07-04 MED ORDER — SEVOFLURANE IN SOLN
RESPIRATORY_TRACT | Status: AC
  Filled 2020-07-04: qty 250

## 2020-07-04 MED ORDER — MIDAZOLAM HCL 2 MG/2ML IJ SOLN
INTRAMUSCULAR | Status: DC | PRN
  Administered 2020-07-04: 2 mg via INTRAVENOUS

## 2020-07-04 MED ORDER — DEXAMETHASONE SODIUM PHOSPHATE 10 MG/ML IJ SOLN
INTRAMUSCULAR | Status: DC | PRN
  Administered 2020-07-04: 10 mg via INTRAVENOUS

## 2020-07-04 MED ORDER — PROPOFOL 500 MG/50ML IV EMUL
INTRAVENOUS | Status: DC | PRN
  Administered 2020-07-04: 200 ug/kg/min via INTRAVENOUS

## 2020-07-04 MED ORDER — FAMOTIDINE 20 MG PO TABS
ORAL_TABLET | ORAL | Status: AC
  Administered 2020-07-04: 20 mg via ORAL
  Filled 2020-07-04: qty 1

## 2020-07-04 MED ORDER — CELECOXIB 200 MG PO CAPS
ORAL_CAPSULE | ORAL | Status: AC
  Administered 2020-07-04: 200 mg via ORAL
  Filled 2020-07-04: qty 1

## 2020-07-04 MED ORDER — PROPOFOL 10 MG/ML IV BOLUS
INTRAVENOUS | Status: AC
  Filled 2020-07-04: qty 20

## 2020-07-04 MED ORDER — FENTANYL CITRATE (PF) 100 MCG/2ML IJ SOLN
INTRAMUSCULAR | Status: DC | PRN
  Administered 2020-07-04 (×2): 50 ug via INTRAVENOUS

## 2020-07-04 MED ORDER — CHLORHEXIDINE GLUCONATE 0.12 % MT SOLN
OROMUCOSAL | Status: AC
  Administered 2020-07-04: 15 mL via OROMUCOSAL
  Filled 2020-07-04: qty 15

## 2020-07-04 MED ORDER — ONDANSETRON HCL 4 MG/2ML IJ SOLN: 4.0000 mg | Freq: Once | INTRAMUSCULAR | Status: DC | PRN

## 2020-07-04 MED ORDER — ONDANSETRON HCL 4 MG/2ML IJ SOLN
INTRAMUSCULAR | Status: DC | PRN
  Administered 2020-07-04: 4 mg via INTRAVENOUS

## 2020-07-04 MED ORDER — CEFAZOLIN SODIUM-DEXTROSE 2-4 GM/100ML-% IV SOLN
INTRAVENOUS | Status: AC
  Filled 2020-07-04: qty 100

## 2020-07-04 MED ORDER — GABAPENTIN 300 MG PO CAPS
ORAL_CAPSULE | ORAL | Status: AC
  Administered 2020-07-04: 300 mg via ORAL
  Filled 2020-07-04: qty 1

## 2020-07-04 MED ORDER — MEPERIDINE HCL 25 MG/ML IJ SOLN: 6.2500 mg | INTRAMUSCULAR | Status: DC | PRN

## 2020-07-04 MED ORDER — ACETAMINOPHEN 500 MG PO TABS
ORAL_TABLET | ORAL | Status: AC
  Administered 2020-07-04: 1000 mg via ORAL
  Filled 2020-07-04: qty 2

## 2020-07-04 MED ORDER — IBUPROFEN 800 MG PO TABS: 800.0000 mg | ORAL_TABLET | Freq: Three times a day (TID) | ORAL | 0 refills | Status: DC | PRN

## 2020-07-04 MED ORDER — FENTANYL CITRATE (PF) 100 MCG/2ML IJ SOLN
INTRAMUSCULAR | Status: AC
  Filled 2020-07-04: qty 2

## 2020-07-04 MED ORDER — BUPIVACAINE-EPINEPHRINE (PF) 0.25% -1:200000 IJ SOLN
INTRAMUSCULAR | Status: DC | PRN
  Administered 2020-07-04: 10 mL
  Administered 2020-07-04: 8 mL

## 2020-07-04 MED ORDER — PROPOFOL 500 MG/50ML IV EMUL
INTRAVENOUS | Status: AC
  Filled 2020-07-04: qty 50

## 2020-07-04 MED ORDER — MIDAZOLAM HCL 2 MG/2ML IJ SOLN
INTRAMUSCULAR | Status: AC
  Filled 2020-07-04: qty 2

## 2020-07-04 SURGICAL SUPPLY — 28 items
ADH SKN CLS APL DERMABOND .7 (GAUZE/BANDAGES/DRESSINGS) ×1
APL PRP STRL LF DISP 70% ISPRP (MISCELLANEOUS) ×1
BLADE SURG 15 STRL LF DISP TIS (BLADE) ×1 IMPLANT
BLADE SURG 15 STRL SS (BLADE) ×2
CANISTER SUCT 1200ML W/VALVE (MISCELLANEOUS) ×2 IMPLANT
CHLORAPREP W/TINT 26 (MISCELLANEOUS) ×2 IMPLANT
COVER WAND RF STERILE (DRAPES) ×2 IMPLANT
DERMABOND ADVANCED (GAUZE/BANDAGES/DRESSINGS) ×1
DERMABOND ADVANCED .7 DNX12 (GAUZE/BANDAGES/DRESSINGS) ×1 IMPLANT
DRAPE LAPAROTOMY 77X122 PED (DRAPES) ×2 IMPLANT
ELECT CAUTERY BLADE TIP 2.5 (TIP) ×2
ELECT REM PT RETURN 9FT ADLT (ELECTROSURGICAL) ×2
ELECTRODE CAUTERY BLDE TIP 2.5 (TIP) ×1 IMPLANT
ELECTRODE REM PT RTRN 9FT ADLT (ELECTROSURGICAL) ×1 IMPLANT
GLOVE SURG ORTHO LTX SZ7.5 (GLOVE) ×2 IMPLANT
GOWN STRL REUS W/ TWL LRG LVL3 (GOWN DISPOSABLE) ×2 IMPLANT
GOWN STRL REUS W/TWL LRG LVL3 (GOWN DISPOSABLE) ×4
KIT TURNOVER KIT A (KITS) ×2 IMPLANT
MANIFOLD NEPTUNE II (INSTRUMENTS) ×2 IMPLANT
NEEDLE HYPO 22GX1.5 SAFETY (NEEDLE) ×2 IMPLANT
PACK BASIN MINOR ARMC (MISCELLANEOUS) ×2 IMPLANT
SUT MNCRL 4-0 (SUTURE) ×2
SUT MNCRL 4-0 27XMFL (SUTURE) ×1
SUT VIC AB 3-0 SH 27 (SUTURE) ×2
SUT VIC AB 3-0 SH 27X BRD (SUTURE) ×1 IMPLANT
SUTURE MNCRL 4-0 27XMF (SUTURE) ×1 IMPLANT
SYR 10ML LL (SYRINGE) ×2 IMPLANT
SYR BULB IRRIG 60ML STRL (SYRINGE) ×2 IMPLANT

## 2020-07-04 NOTE — Transfer of Care (Signed)
Immediate Anesthesia Transfer of Care Note  Patient: Eric Morse  Procedure(s) Performed: EXCISION MASS, right anterior thigh (Right)  Patient Location: PACU  Anesthesia Type:MAC  Level of Consciousness: drowsy and patient cooperative  Airway & Oxygen Therapy: Patient Spontanous Breathing and Patient connected to face mask oxygen  Post-op Assessment: Report given to RN and Post -op Vital signs reviewed and stable  Post vital signs: Reviewed and stable  Last Vitals:  Vitals Value Taken Time  BP 121/71 07/04/20 1400  Temp 36.2 C 07/04/20 1357  Pulse 49 07/04/20 1401  Resp 15 07/04/20 1401  SpO2 100 % 07/04/20 1401  Vitals shown include unvalidated device data.  Last Pain:  Vitals:   07/04/20 1357  TempSrc:   PainSc: Asleep         Complications: No notable events documented.

## 2020-07-04 NOTE — Anesthesia Preprocedure Evaluation (Signed)
Anesthesia Evaluation  Patient identified by MRN, date of birth, ID band Patient awake    Reviewed: Allergy & Precautions, NPO status , Patient's Chart, lab work & pertinent test results  Airway Mallampati: II  TM Distance: >3 FB Neck ROM: Full    Dental no notable dental hx.    Pulmonary neg pulmonary ROS,    Pulmonary exam normal        Cardiovascular negative cardio ROS Normal cardiovascular exam     Neuro/Psych negative neurological ROS  negative psych ROS   GI/Hepatic negative GI ROS, Neg liver ROS,   Endo/Other  negative endocrine ROS  Renal/GU negative Renal ROS  negative genitourinary   Musculoskeletal negative musculoskeletal ROS (+)   Abdominal   Peds negative pediatric ROS (+)  Hematology negative hematology ROS (+)   Anesthesia Other Findings   Reproductive/Obstetrics negative OB ROS                            Anesthesia Physical Anesthesia Plan  ASA: 1  Anesthesia Plan: MAC   Post-op Pain Management:    Induction: Intravenous  PONV Risk Score and Plan: 2 and Midazolam  Airway Management Planned: Mask  Additional Equipment:   Intra-op Plan:   Post-operative Plan:   Informed Consent: I have reviewed the patients History and Physical, chart, labs and discussed the procedure including the risks, benefits and alternatives for the proposed anesthesia with the patient or authorized representative who has indicated his/her understanding and acceptance.       Plan Discussed with: CRNA, Anesthesiologist and Surgeon  Anesthesia Plan Comments:         Anesthesia Quick Evaluation

## 2020-07-04 NOTE — Anesthesia Postprocedure Evaluation (Signed)
Anesthesia Post Note  Patient: Eric Morse  Procedure(s) Performed: EXCISION MASS, right anterior thigh (Right)  Patient location during evaluation: PACU Anesthesia Type: MAC Level of consciousness: awake and alert, awake and oriented Pain management: pain level controlled Vital Signs Assessment: post-procedure vital signs reviewed and stable Respiratory status: spontaneous breathing, nonlabored ventilation and respiratory function stable Cardiovascular status: blood pressure returned to baseline and stable Postop Assessment: no apparent nausea or vomiting Anesthetic complications: no   No notable events documented.   Last Vitals:  Vitals:   07/04/20 1423 07/04/20 1437  BP: 124/76 121/72  Pulse: (!) 51 66  Resp: 14 15  Temp: (!) 36.2 C   SpO2: 99% 99%    Last Pain:  Vitals:   07/04/20 1437  TempSrc:   PainSc: 0-No pain                 Phill Mutter

## 2020-07-04 NOTE — Telephone Encounter (Signed)
Patient was instructed not to lift over 15 pounds until after his post op July 7,2022.

## 2020-07-04 NOTE — Anesthesia Procedure Notes (Signed)
Procedure Name: General with mask airway Date/Time: 07/04/2020 1:33 PM Performed by: Mohammed Kindle, CRNA Pre-anesthesia Checklist: Patient identified, Emergency Drugs available, Suction available and Patient being monitored Patient Re-evaluated:Patient Re-evaluated prior to induction Oxygen Delivery Method: Simple face mask Induction Type: IV induction Placement Confirmation: positive ETCO2 and CO2 detector Dental Injury: Teeth and Oropharynx as per pre-operative assessment

## 2020-07-04 NOTE — Op Note (Signed)
Excision of benign 4.4 cm right anterior thigh mass.  Pre-operative Diagnosis: Right anterior thigh mass.  Post-operative Diagnosis: 4.4 cm right anterior thigh dermal cyst.  Surgeon: Ronny Bacon, M.D., FACS  Anesthesia: Local MAC  Findings: Consistent with dermal inclusion/sebaceous cyst.  Estimated Blood Loss: 4 mL         Specimens: Overlying dermis with underlying 4.4 cm cyst, excised in its entirety.          Complications: none              Procedure Details  The patient was seen again in the Holding Room. The benefits, complications, treatment options, and expected outcomes were discussed with the patient. The risks of bleeding, infection, recurrence of symptoms, failure to resolve symptoms, unanticipated injury, prosthetic placement, prosthetic infection, any of which could require further surgery were reviewed with the patient. The likelihood of improving the patient's symptoms with return to their baseline status is excellent.  The patient and/or family concurred with the proposed plan, giving informed consent.  The patient was taken to Operating Room, identified and the procedure verified.    Prior to the induction of general anesthesia, antibiotic prophylaxis was administered. VTE prophylaxis was in place.  MAC was then administered and tolerated well. After the induction, the patient was positioned in the supine position and the anterior right thigh was prepped with Chloraprep and draped in the sterile fashion.  A Time Out was held and the above information confirmed. Local infiltration with quarter percent Marcaine with epinephrine is infiltrated into the apices of the planned incision, and utilized to hydrodissect some of the thinned dermis from the underlying cystic mass. An elliptical incision is made to excise the thinned dermis overlying the mass. The cyst is then sharply dissected from the overlying dermis circumferentially and completely.  It is then sharply excised  from the underlying soft tissues.  Hemostasis is confirmed with electrocautery, wound is then irrigated.  There was a paucity of adipose tissues in this area.  Reapproximated with 3-0 Vicryl interrupted sutures closing the dead space and reapproximating the overlying skin with 4-0 Monocryl subcuticular.  Dermabond applied. Procedures tolerated well.    Ronny Bacon M.D., [facility] 07/04/2020 2:02 PM

## 2020-07-04 NOTE — Discharge Instructions (Signed)

## 2020-07-04 NOTE — Telephone Encounter (Signed)
Patient has excision mass thigh today, 07/04/20 with Dr Claudine Mouton.  The patient that he was told initially that he would have a 15 lb restriction for 4 to 6 weeks, but then he had a nurse to say that he can start lifting when he feels up to it.  Patient wants to confirm his lifting restrictions and for what length of time, also the patient wants to know when he can start exercising and running.  Please call him. Thank you.

## 2020-07-04 NOTE — Interval H&P Note (Signed)
History and Physical Interval Note:  07/04/2020 12:41 PM  Eric Morse  has presented today for surgery, with the diagnosis of right anterior thigh mass.  The various methods of treatment have been discussed with the patient and family. After consideration of risks, benefits and other options for treatment, the patient has consented to  Procedure(s): EXCISION MASS, right anterior thigh (Right) as a surgical intervention.  The patient's history has been reviewed, patient examined, no change in status, stable for surgery.  I have reviewed the patient's chart and labs.  Questions were answered to the patient's satisfaction.    Right thigh is marked.   Campbell Lerner

## 2020-07-05 ENCOUNTER — Telehealth: Payer: Self-pay

## 2020-07-05 ENCOUNTER — Encounter: Payer: Self-pay | Admitting: Surgery

## 2020-07-05 NOTE — Telephone Encounter (Signed)
Questions answered regarding running, showering and when to expect the glue to come off and if sutures need to be removed or are they dissolvable.

## 2020-07-06 LAB — SURGICAL PATHOLOGY

## 2020-07-07 ENCOUNTER — Encounter: Payer: Self-pay | Admitting: Family Medicine

## 2020-07-19 ENCOUNTER — Encounter: Payer: BC Managed Care – PPO | Admitting: Surgery

## 2020-07-24 ENCOUNTER — Encounter: Payer: Self-pay | Admitting: Surgery

## 2020-07-24 ENCOUNTER — Other Ambulatory Visit: Payer: Self-pay

## 2020-07-24 ENCOUNTER — Ambulatory Visit (INDEPENDENT_AMBULATORY_CARE_PROVIDER_SITE_OTHER): Payer: Self-pay | Admitting: Surgery

## 2020-07-24 VITALS — BP 106/74 | HR 66 | Temp 98.2°F | Ht 71.0 in | Wt 197.6 lb

## 2020-07-24 DIAGNOSIS — R229 Localized swelling, mass and lump, unspecified: Secondary | ICD-10-CM

## 2020-07-24 NOTE — Patient Instructions (Signed)
Please call our office with you have any questions or concerns. You may resume normal activities.

## 2020-07-24 NOTE — Progress Notes (Signed)
[facility] POST-OP OFFICE VISIT  07/24/2020  HPI: Eric Morse is a 30 y.o. male 20 days s/p right thigh dermal cyst excision.  Doing well, no drainage, no issues.  Denies pain.   Vital signs: There were no vitals taken for this visit.   Physical Exam: Constitutional: appears well   Skin: incision is c/d/I.   Assessment/Plan: This is a 30 y.o. male 20 days s/p excision of dermal cyst.  Doing well.   Patient Active Problem List   Diagnosis Date Noted   Localized skin mass, lump, or swelling 08/10/2012   Health care maintenance 03/04/2011    - f/u PRN, glad to see this pleasant pt again for any concerns.    Campbell Lerner M.D., FACS 07/24/2020, 9:02 AM

## 2021-05-04 ENCOUNTER — Encounter: Payer: Self-pay | Admitting: Emergency Medicine

## 2021-05-04 ENCOUNTER — Ambulatory Visit
Admission: EM | Admit: 2021-05-04 | Discharge: 2021-05-04 | Disposition: A | Discharge status: Home / Self Care | Payer: BC Managed Care – PPO | Attending: Emergency Medicine | Admitting: Emergency Medicine

## 2021-05-04 DIAGNOSIS — J020 Streptococcal pharyngitis: Secondary | ICD-10-CM

## 2021-05-04 LAB — POCT RAPID STREP A (OFFICE): Rapid Strep A Screen: POSITIVE — AB

## 2021-05-04 MED ORDER — PENICILLIN G BENZATHINE 1200000 UNIT/2ML IM SUSY
1.2000 10*6.[IU] | PREFILLED_SYRINGE | Freq: Once | INTRAMUSCULAR | Status: AC
  Administered 2021-05-04: 1.2 10*6.[IU] via INTRAMUSCULAR

## 2021-05-04 NOTE — ED Provider Notes (Signed)
?[facility] ? ? ? ?CSN: [id number] ?Arrival date & time: 05/04/21  1544 ? ? ?  ? ?History   ?Chief Complaint ?Chief Complaint  ?Patient presents with  ? Sore Throat  ? ? ?HPI ?Eric Morse is a 31 y.o. male.  Patient presents with sore throat x1 day.  He also has body aches.  He reports feeling hot and cold.  No rash, cough, shortness of breath, vomiting, diarrhea, or other symptoms.  Treatment at home with Tylenol.  He was exposed to strep at home this week. ? ?The history is provided by the patient.  ? ?Past Medical History:  ?Diagnosis Date  ? No pertinent past medical history   ? ? ?Patient Active Problem List  ? Diagnosis Date Noted  ? Health care maintenance 03/04/2011  ? ? ?Past Surgical History:  ?Procedure Laterality Date  ? MASS EXCISION Right 07/04/2020  ? thigh dermal cyst Campbell Lerner, MD)  ? NO PAST SURGERIES    ? ? ? ? ? ?Home Medications   ? ?Prior to Admission medications   ?Medication Sig Start Date End Date Taking? Authorizing Provider  ?Multiple Vitamin (MULTIVITAMIN ADULT) TABS Take 1 tablet by mouth daily. 05/18/20  Yes Eustaquio Boyden, MD  ?ibuprofen (ADVIL) 800 MG tablet Take 1 tablet (800 mg total) by mouth every 8 (eight) hours as needed. 07/04/20   Campbell Lerner, MD  ? ? ?Family History ?Family History  ?Problem Relation Age of Onset  ? Stroke Maternal Grandfather 60  ?     multiple  ? Leukemia Son 1  ?     dx age 45mo  ? Coronary artery disease Neg Hx   ? Diabetes Neg Hx   ? Cancer Neg Hx   ? Hypertension Neg Hx   ? Hyperlipidemia Neg Hx   ? ? ?Social History ?Social History  ? ?Tobacco Use  ? Smoking status: Never  ? Smokeless tobacco: Never  ?Vaping Use  ? Vaping Use: Never used  ?Substance Use Topics  ? Alcohol use: Yes  ?  Comment: once weekly, 3-4 beers at a time, no binging  ? Drug use: No  ? ? ? ?Allergies   ?Patient has no known allergies. ? ? ?Review of Systems ?Review of Systems  ?Constitutional:  Negative for chills and fever.  ?HENT:  Positive for sore  throat. Negative for ear pain.   ?Respiratory:  Negative for cough and shortness of breath.   ?Cardiovascular:  Negative for chest pain and palpitations.  ?Gastrointestinal:  Negative for diarrhea and vomiting.  ?Skin:  Negative for color change and rash.  ?All other systems reviewed and are negative. ? ? ?Physical Exam ?Triage Vital Signs ?ED Triage Vitals  ?Enc Vitals Group  ?   BP   ?   Pulse   ?   Resp   ?   Temp   ?   Temp src   ?   SpO2   ?   Weight   ?   Height   ?   Head Circumference   ?   Peak Flow   ?   Pain Score   ?   Pain Loc   ?   Pain Edu?   ?   Excl. in GC?   ? ?No data found. ? ?Updated Vital Signs ?BP 136/79   Pulse 82   Temp 98.8 ?F (37.1 ?C)   Resp 18   SpO2 97%  ? ?Visual Acuity ?Right Eye Distance:   ?Left  Eye Distance:   ?Bilateral Distance:   ? ?Right Eye Near:   ?Left Eye Near:    ?Bilateral Near:    ? ?Physical Exam ?Vitals and nursing note reviewed.  ?Constitutional:   ?   General: He is not in acute distress. ?   Appearance: Normal appearance. He is well-developed. He is not ill-appearing.  ?HENT:  ?   Head: Normocephalic and atraumatic.  ?   Right Ear: Tympanic membrane normal.  ?   Left Ear: Tympanic membrane normal.  ?   Nose: Nose normal.  ?   Mouth/Throat:  ?   Mouth: Mucous membranes are moist.  ?   Pharynx: Posterior oropharyngeal erythema present.  ?Cardiovascular:  ?   Rate and Rhythm: Normal rate and regular rhythm.  ?   Heart sounds: Normal heart sounds.  ?Pulmonary:  ?   Effort: Pulmonary effort is normal. No respiratory distress.  ?   Breath sounds: Normal breath sounds.  ?Musculoskeletal:  ?   Cervical back: Neck supple.  ?Skin: ?   General: Skin is warm and dry.  ?Neurological:  ?   Mental Status: He is alert.  ?Psychiatric:     ?   Mood and Affect: Mood normal.     ?   Behavior: Behavior normal.  ? ? ? ?UC Treatments / Results  ?Labs ?(all labs ordered are listed, but only abnormal results are displayed) ?Labs Reviewed  ?POCT RAPID STREP A (OFFICE) - Abnormal; Notable  for the following components:  ?    Result Value  ? Rapid Strep A Screen Positive (*)   ? All other components within normal limits  ? ? ?EKG ? ? ?Radiology ?No results found. ? ?Procedures ?Procedures (including critical care time) ? ?Medications Ordered in UC ?Medications  ?penicillin g benzathine (BICILLIN LA) 1200000 UNIT/2ML injection 1.2 Million Units (has no administration in time range)  ? ? ?Initial Impression / Assessment and Plan / UC Course  ?I have reviewed the triage vital signs and the nursing notes. ? ?Pertinent labs & imaging results that were available during my care of the patient were reviewed by me and considered in my medical decision making (see chart for details). ? ?Strep pharyngitis.  Rapid strep positive.  Patient prefers injectable antibiotic.  Treated with Bicillin LA.  Tylenol or ibuprofen as needed.  Education provided on strep pharyngitis.  Instructed patient to follow-up with his PCP if his symptoms are not improving.  He agrees to plan of care. ? ? ?Final Clinical Impressions(s) / UC Diagnoses  ? ?Final diagnoses:  ?Strep pharyngitis  ? ? ? ?Discharge Instructions   ? ?  ?You were given an injection of a long-acting penicillin to treat your strep throat.  No other antibiotic is required at this time.  Take Tylenol or ibuprofen as needed for fever or discomfort.  Follow up with your primary care provider if your symptoms are not improving.   ? ? ? ? ? ?ED Prescriptions   ?None ?  ? ?PDMP not reviewed this encounter. ?  ?Mickie Bail, NP ?05/04/21 1604 ? ?

## 2021-05-04 NOTE — Discharge Instructions (Addendum)
You were given an injection of a long-acting penicillin to treat your strep throat.  No other antibiotic is required at this time.  Take Tylenol or ibuprofen as needed for fever or discomfort.  Follow up with your primary care provider if your symptoms are not improving.   ? ?

## 2021-05-04 NOTE — ED Triage Notes (Signed)
Patient c/o sore throat that started last night.  ? ?Patient endorses "I've been feeling hot and cold".  ? ?Patient endorses generalized body aches.  ? ?Patient endorses recent strep exposure in household.  ? ? ?

## 2021-05-24 ENCOUNTER — Encounter: Payer: Self-pay | Admitting: Family

## 2021-05-24 ENCOUNTER — Ambulatory Visit (INDEPENDENT_AMBULATORY_CARE_PROVIDER_SITE_OTHER): Payer: BC Managed Care – PPO | Admitting: Family

## 2021-05-24 VITALS — BP 104/78 | HR 60 | Temp 99.2°F | Resp 16 | Ht 71.0 in | Wt 190.4 lb

## 2021-05-24 DIAGNOSIS — J020 Streptococcal pharyngitis: Secondary | ICD-10-CM | POA: Diagnosis not present

## 2021-05-24 DIAGNOSIS — J029 Acute pharyngitis, unspecified: Secondary | ICD-10-CM | POA: Insufficient documentation

## 2021-05-24 HISTORY — DX: Streptococcal pharyngitis: J02.0

## 2021-05-24 LAB — POCT RAPID STREP A (OFFICE): Rapid Strep A Screen: POSITIVE — AB

## 2021-05-24 MED ORDER — CEFDINIR 300 MG PO CAPS: 300.0000 mg | ORAL_CAPSULE | Freq: Two times a day (BID) | ORAL | 0 refills | Status: AC

## 2021-05-24 NOTE — Progress Notes (Signed)
? ?Established Patient Office Visit ? ?Subjective:  ?Patient ID: Eric Morse, male    DOB: 06-25-1990  Age: 31 y.o. MRN: [id number] ? ?CC:  ?Chief Complaint  ?Patient presents with  ?? Sore Throat  ?  X 2 days  ? ? ?HPI ?Eric Morse is here today with concerns.  ? ?Three weeks ago was dx with strep (4/22), he was given bicillin injection.  ?He felt symptom free for a few years.  ?Sore throat again two days ago, wife again strep positive. Son sick at home as well 2.5 years.  ? ? ?Past Medical History:  ?Diagnosis Date  ?? No pertinent past medical history   ? ? ?Past Surgical History:  ?Procedure Laterality Date  ?? MASS EXCISION Right 07/04/2020  ? thigh dermal cyst Campbell Lerner, MD)  ?? NO PAST SURGERIES    ? ? ?Family History  ?Problem Relation Age of Onset  ?? Stroke Maternal Grandfather 60  ?     multiple  ?? Leukemia Son 1  ?     dx age 59mo  ?? Coronary artery disease Neg Hx   ?? Diabetes Neg Hx   ?? Cancer Neg Hx   ?? Hypertension Neg Hx   ?? Hyperlipidemia Neg Hx   ? ? ?Social History  ? ?Socioeconomic History  ?? Marital status: Married  ?  Spouse name: Not on file  ?? Number of children: Not on file  ?? Years of education: Not on file  ?? Highest education level: Not on file  ?Occupational History  ?? Not on file  ?Tobacco Use  ?? Smoking status: Never  ?? Smokeless tobacco: Never  ?Vaping Use  ?? Vaping Use: Never used  ?Substance and Sexual Activity  ?? Alcohol use: Yes  ?  Comment: once weekly, 3-4 beers at a time, no binging  ?? Drug use: No  ?? Sexual activity: Yes  ?  Partners: Female  ?  Comment: wife  ?Other Topics Concern  ?? Not on file  ?Social History Narrative  ? Caffeine: 1 cup coffee/day  ? Lives with wife, [name], no pets  ? Occupation: [employer]  ? EduHaroldine Laws, senior, middle school education  ? Activity: basketball, running, swimming   ? Diet: water, fruits/vegetables daily, red meat 3-4x/wk, fish 1-2x/wk  ? ?Social Determinants of Health  ? ?Financial Resource Strain: Not  on file  ?Food Insecurity: Not on file  ?Transportation Needs: Not on file  ?Physical Activity: Not on file  ?Stress: Not on file  ?Social Connections: Not on file  ?Intimate Partner Violence: Not on file  ? ? ?Outpatient Medications Prior to Visit  ?Medication Sig Dispense Refill  ?? ibuprofen (ADVIL) 800 MG tablet Take 1 tablet (800 mg total) by mouth every 8 (eight) hours as needed. 30 tablet 0  ?? Multiple Vitamin (MULTIVITAMIN ADULT) TABS Take 1 tablet by mouth daily.    ? ?No facility-administered medications prior to visit.  ? ? ?No Known Allergies ? ?ROS ?Review of Systems  ?Constitutional:  Negative for chills, fatigue and fever.  ?HENT:  Positive for sore throat. Negative for congestion, ear pain, postnasal drip, rhinorrhea, sinus pressure, sinus pain and sneezing.   ?Eyes:  Negative for discharge.  ?Respiratory:  Negative for cough, chest tightness, shortness of breath and wheezing.   ?Cardiovascular:  Negative for chest pain and palpitations.  ? ?  ?Objective:  ?  ?Physical Exam ?Constitutional:   ?   General: He is awake. He is not in  acute distress. ?   Appearance: Normal appearance. He is not ill-appearing.  ?HENT:  ?   Right Ear: Tympanic membrane is retracted.  ?   Left Ear: Tympanic membrane is retracted.  ?   Nose: Nose normal.  ?   Right Turbinates: Not enlarged or swollen.  ?   Left Turbinates: Not enlarged or swollen.  ?   Right Sinus: No maxillary sinus tenderness or frontal sinus tenderness.  ?   Left Sinus: No maxillary sinus tenderness or frontal sinus tenderness.  ?   Mouth/Throat:  ?   Mouth: Mucous membranes are moist.  ?   Pharynx: Posterior oropharyngeal erythema present. No pharyngeal swelling or oropharyngeal exudate.  ?   Tonsils: 1+ on the right. 1+ on the left.  ?Eyes:  ?   Extraocular Movements: Extraocular movements intact.  ?   Pupils: Pupils are equal, round, and reactive to light.  ?Cardiovascular:  ?   Rate and Rhythm: Normal rate and regular rhythm.  ?Pulmonary:  ?    Effort: Pulmonary effort is normal.  ?   Breath sounds: Normal breath sounds. No wheezing.  ?Neurological:  ?   Mental Status: He is alert.  ? ? ?BP 104/78   Pulse 60   Temp 99.2 ?F (37.3 ?C)   Resp 16   Ht 5\' 11"  (1.803 m)   Wt 190 lb 6 oz (86.4 kg)   SpO2 97%   BMI 26.55 kg/m?  ?Wt Readings from Last 3 Encounters:  ?05/24/21 190 lb 6 oz (86.4 kg)  ?07/24/20 197 lb 9.6 oz (89.6 kg)  ?06/25/20 195 lb (88.5 kg)  ? ? ? ?Health Maintenance Due  ?Topic Date Due  ?? HIV Screening  Never done  ?? Hepatitis C Screening  Never done  ?? COVID-19 Vaccine (3 - Booster for Pfizer series) 02/16/2020  ? ? ?There are no preventive care reminders to display for this patient. ? ?No results found for: TSH ?No results found for: WBC, HGB, HCT, MCV, PLT ?Lab Results  ?Component Value Date  ? NA 139 05/16/2020  ? K 4.1 05/16/2020  ? CO2 31 05/16/2020  ? GLUCOSE 113 (H) 05/16/2020  ? BUN 13 05/16/2020  ? CREATININE 0.92 05/16/2020  ? CALCIUM 9.7 05/16/2020  ? GFR 110.56 05/16/2020  ? ?No results found for: HGBA1C ? ?  ?Assessment & Plan:  ? ?Problem List Items Addressed This Visit   ? ?  ? Respiratory  ? Strep pharyngitis  ?  Strep tested positive in office.  ?rx cefdinir 300 mg po bix x 5 days (already had bicillin) ?Ibuprofen/tylenol prn sore throat/fever ?Pt told to F/u if no improvement in the next 2-3 days. ? ?  ?  ? Relevant Medications  ? cefdinir (OMNICEF) 300 MG capsule  ?  ? Other  ? Sore throat - Primary  ?  Strep tested in office ?Warm salt water gargles  ? ?  ?  ? Relevant Medications  ? cefdinir (OMNICEF) 300 MG capsule  ? Other Relevant Orders  ? POCT rapid strep A (Completed)  ? ? ?Meds ordered this encounter  ?Medications  ?? cefdinir (OMNICEF) 300 MG capsule  ?  Sig: Take 1 capsule (300 mg total) by mouth 2 (two) times daily for 5 days.  ?  Dispense:  10 capsule  ?  Refill:  0  ?  Order Specific Question:   Supervising Provider  ?  Answer:   BEDSOLE, AMY E [[id number]]  ? ? ?Follow-up: Return if symptoms worsen  or fail  to improve with pcp.  ? ? ?Mort Sawyers, FNP ?

## 2021-05-24 NOTE — Assessment & Plan Note (Signed)
Strep tested in office Warm salt water gargles   

## 2021-05-24 NOTE — Assessment & Plan Note (Signed)
Strep tested positive in office.  ?rx cefdinir 300 mg po bix x 5 days (already had bicillin) ?Ibuprofen/tylenol prn sore throat/fever ?Pt told to F/u if no improvement in the next 2-3 days. ? ?

## 2021-05-24 NOTE — Patient Instructions (Signed)
You were found to be strep positive,  Take antibiotics that have been sent to the pharmacy.  Change your toothbrush after 24 hours on the antibiotics.  Gargle with warm salt water as needed for sore throat.   Due to recent changes in healthcare laws, you may see results of your imaging and/or laboratory studies on MyChart before I have had a chance to review them.  I understand that in some cases there may be results that are confusing or concerning to you. Please understand that not all results are received at the same time and often I may need to interpret multiple results in order to provide you with the best plan of care or course of treatment. Therefore, I ask that you please give me 2 business days to thoroughly review all your results before contacting my office for clarification. Should we see a critical lab result, you will be contacted sooner.   It was a pleasure seeing you today! Please do not hesitate to reach out with any questions and or concerns.  Regards,   Kamyah Wilhelmsen FNP-C    

## 2021-06-07 ENCOUNTER — Ambulatory Visit (INDEPENDENT_AMBULATORY_CARE_PROVIDER_SITE_OTHER): Payer: BC Managed Care – PPO | Admitting: Family Medicine

## 2021-06-07 ENCOUNTER — Encounter: Payer: Self-pay | Admitting: Family Medicine

## 2021-06-07 VITALS — BP 118/68 | HR 54 | Temp 98.6°F | Ht 71.0 in | Wt 190.0 lb

## 2021-06-07 DIAGNOSIS — Z20818 Contact with and (suspected) exposure to other bacterial communicable diseases: Secondary | ICD-10-CM

## 2021-06-07 DIAGNOSIS — J020 Streptococcal pharyngitis: Secondary | ICD-10-CM

## 2021-06-07 LAB — POCT RAPID STREP A (OFFICE): Rapid Strep A Screen: POSITIVE — AB

## 2021-06-07 MED ORDER — CEFDINIR 300 MG PO CAPS: 600.0000 mg | ORAL_CAPSULE | Freq: Every day | ORAL | 0 refills | Status: DC

## 2021-06-07 NOTE — Assessment & Plan Note (Signed)
3rd recurrence of strep in the past 2 weeks.  He is asymptomatic but is testing positive - given same history of strep anticipate early in course of illness, will Rx cefdinir 600mg  QD x10 days.  Update if worsening symptoms despite treatment.

## 2021-06-07 NOTE — Progress Notes (Signed)
Patient ID: Eric Morse, male    DOB: 10-01-1990, 31 y.o.   MRN: [id number]  This visit was conducted in person.  BP 118/68   Pulse (!) 54   Temp 98.6 F (37 C) (Temporal)   Ht 5\' 11"  (1.803 m)   Wt 190 lb (86.2 kg)   SpO2 98%   BMI 26.50 kg/m    CC: strep exposure Subjective:   HPI: Eric Morse is a 31 y.o. male presenting on 06/07/2021 for Sore Throat (C/o recent exposure to strep by family members. Denies any sxs. Requests testing. )   05/04/2021-seen at urgent care with diagnosis of streptococcal pharyngitis treated with 1,200,000 units of penicillin G IM. 05/24/2021-saw Tabitha NP diagnosed with recurrent strep throat treated with cefdinir 300 mg twice daily 5-day course.  Wife has also been sick with recurrent strep as well as son (2.5yo) who was treated for leukemia last year with bone marrow transplant and CAR-T cell therapy.  Today presents asymptomatic but son tested positive Monday and wife just tested positive Wed. Both have had strep x3.  Son is likely being exposed to strep at daycare (it is going around there).   Symptoms have fully resolved after both antibiotic courses.   Had biopsy confirmed epidermal cyst excised from right anterior thigh last year by Dr. Tuesday (06/2020).     Relevant past medical, surgical, family and social history reviewed and updated as indicated. Interim medical history since our last visit reviewed. Allergies and medications reviewed and updated. Outpatient Medications Prior to Visit  Medication Sig Dispense Refill   ibuprofen (ADVIL) 800 MG tablet Take 1 tablet (800 mg total) by mouth every 8 (eight) hours as needed. 30 tablet 0   Multiple Vitamin (MULTIVITAMIN ADULT) TABS Take 1 tablet by mouth daily.     No facility-administered medications prior to visit.     Per HPI unless specifically indicated in ROS section below Review of Systems  Objective:  BP 118/68   Pulse (!) 54   Temp 98.6 F (37 C) (Temporal)    Ht 5\' 11"  (1.803 m)   Wt 190 lb (86.2 kg)   SpO2 98%   BMI 26.50 kg/m   Wt Readings from Last 3 Encounters:  06/07/21 190 lb (86.2 kg)  05/24/21 190 lb 6 oz (86.4 kg)  07/24/20 197 lb 9.6 oz (89.6 kg)      Physical Exam Vitals and nursing note reviewed.  Constitutional:      Appearance: Normal appearance. He is not ill-appearing.  HENT:     Head: Normocephalic and atraumatic.     Right Ear: Hearing, tympanic membrane, ear canal and external ear normal. There is no impacted cerumen.     Left Ear: Hearing, tympanic membrane, ear canal and external ear normal. There is no impacted cerumen.     Nose: Nose normal. No mucosal edema, congestion or rhinorrhea.     Right Turbinates: Not enlarged or swollen.     Left Turbinates: Not enlarged or swollen.     Right Sinus: No maxillary sinus tenderness or frontal sinus tenderness.     Left Sinus: No maxillary sinus tenderness or frontal sinus tenderness.     Mouth/Throat:     Mouth: Mucous membranes are moist.     Pharynx: Oropharynx is clear. No oropharyngeal exudate or posterior oropharyngeal erythema.  Eyes:     Extraocular Movements: Extraocular movements intact.     Conjunctiva/sclera: Conjunctivae normal.     Pupils: Pupils are equal, round,  and reactive to light.  Cardiovascular:     Rate and Rhythm: Normal rate and regular rhythm.     Pulses: Normal pulses.     Heart sounds: Normal heart sounds. No murmur heard. Pulmonary:     Effort: Pulmonary effort is normal. No respiratory distress.     Breath sounds: Normal breath sounds. No wheezing, rhonchi or rales.  Musculoskeletal:     Cervical back: Normal range of motion and neck supple. No rigidity.     Right lower leg: No edema.     Left lower leg: No edema.  Lymphadenopathy:     Cervical: No cervical adenopathy.  Skin:    General: Skin is warm and dry.     Findings: No rash.  Neurological:     Mental Status: He is alert.  Psychiatric:        Mood and Affect: Mood normal.         Behavior: Behavior normal.      Results for orders placed or performed in visit on 06/07/21  POCT rapid strep A  Result Value Ref Range   Rapid Strep A Screen Positive (A) Negative    Assessment & Plan:   Problem List Items Addressed This Visit     Strep pharyngitis - Primary    3rd recurrence of strep in the past 2 weeks.  He is asymptomatic but is testing positive - given same history of strep anticipate early in course of illness, will Rx cefdinir 600mg  QD x10 days.  Update if worsening symptoms despite treatment.        Relevant Medications   cefdinir (OMNICEF) 300 MG capsule   Other Visit Diagnoses     Exposure to strep throat       Relevant Orders   POCT rapid strep A (Completed)        Meds ordered this encounter  Medications   cefdinir (OMNICEF) 300 MG capsule    Sig: Take 2 capsules (600 mg total) by mouth daily.    Dispense:  20 capsule    Refill:  0   Orders Placed This Encounter  Procedures   POCT rapid strep A     Patient instructions: Strep test faintly positive.  Take second course of cefdinir, this time 2 tablets once daily for 10 days.  Good to see you today.   Follow up plan: Return if symptoms worsen or fail to improve.  , MD

## 2021-06-07 NOTE — Patient Instructions (Addendum)
Strep test faintly positive.  Take second course of cefdinir, this time 2 tablets once daily for 10 days.  Good to see you today.   Strep Throat, Adult Strep throat is an infection in the throat that is caused by bacteria. It is common during the cold months of the year. It mostly affects children who are 875-33 years old. However, people of all ages can get it at any time of the year. This infection spreads from person to person (is contagious) through coughing, sneezing, or having close contact. Your health care provider may use other names to describe the infection. When strep throat affects the tonsils, it is called tonsillitis. When it affects the back of the throat, it is called pharyngitis. What are the causes? This condition is caused by the Streptococcus pyogenes bacteria. What increases the risk? You are more likely to develop this condition if: You care for school-age children, or are around school-age children. Children are more likely to get strep throat and may spread it to others. You spend time in crowded places where the infection can spread easily. You have close contact with someone who has strep throat. What are the signs or symptoms? Symptoms of this condition include: Fever or chills. Redness, swelling, or pain in the tonsils or throat. Pain or difficulty when swallowing. White or yellow spots on the tonsils or throat. Tender glands in the neck and under the jaw. Bad smelling breath. Red rash all over the body. This is rare. How is this diagnosed? This condition is diagnosed by tests that check for the presence and the amount of bacteria that cause strep throat. They are: Rapid strep test. Your throat is swabbed and checked for the presence of bacteria. Results are usually ready in minutes. Throat culture test. Your throat is swabbed. The sample is placed in a cup that allows infections to grow. Results are usually ready in 1 or 2 days. How is this treated? This  condition may be treated with: Medicines that kill germs (antibiotics). Medicines that relieve pain or fever. These include: Ibuprofen or acetaminophen. Aspirin, only for people who are over the age of 33. Throat lozenges. Throat sprays. Follow these instructions at home: Medicines  Take over-the-counter and prescription medicines only as told by your health care provider. Take your antibiotic medicine as told by your health care provider. Do not stop taking the antibiotic even if you start to feel better. Eating and drinking  If you have trouble swallowing, try eating soft foods until your sore throat feels better. Drink enough fluid to keep your urine pale yellow. To help relieve pain, you may have: Warm fluids, such as soup and tea. Cold fluids, such as frozen desserts or popsicles. General instructions Gargle with a salt-water mixture 3-4 times a day or as needed. To make a salt-water mixture, completely dissolve -1 tsp (3-6 g) of salt in 1 cup (237 mL) of warm water. Get plenty of rest. Stay home from work or school until you have been taking antibiotics for 24 hours. Do not use any products that contain nicotine or tobacco. These products include cigarettes, chewing tobacco, and vaping devices, such as e-cigarettes. If you need help quitting, ask your health care provider. It is up to you to get your test results. Ask your health care provider, or the department that is doing the test, when your results will be ready. Keep all follow-up visits. This is important. How is this prevented?  Do not share food, drinking cups, or  personal items that could cause the infection to spread to other people. Wash your hands often with soap and water for at least 20 seconds. If soap and water are not available, use hand sanitizer. Make sure that all people in your house wash their hands well. Have family members tested if they have a sore throat or fever. They may need an antibiotic if they  have strep throat. Contact a health care provider if: You have swelling in your neck that keeps getting bigger. You develop a rash, cough, or earache. You cough up a thick mucus that is green, yellow-brown, or bloody. You have pain or discomfort that does not get better with medicine. Your symptoms seem to be getting worse. You have a fever. Get help right away if: You have new symptoms, such as vomiting, severe headache, stiff or painful neck, chest pain, or shortness of breath. You have severe throat pain, drooling, or changes in your voice. You have swelling of the neck, or the skin on the neck becomes red and tender. You have signs of dehydration, such as tiredness (fatigue), dry mouth, and decreased urination. You become increasingly sleepy, or you cannot wake up completely. Your joints become red or painful. These symptoms may represent a serious problem that is an emergency. Do not wait to see if the symptoms will go away. Get medical help right away. Call your local emergency services (911 in the [place] Do not drive yourself to the hospital. Summary Strep throat is an infection in the throat that is caused by the Streptococcus pyogenes bacteria. This infection is spread from person to person (is contagious) through coughing, sneezing, or having close contact. Take your medicines, including antibiotics, as told by your health care provider. Do not stop taking the antibiotic even if you start to feel better. To prevent the spread of germs, wash your hands well with soap and water. Have others do the same. Do not share food, drinking cups, or personal items. Get help right away if you have new symptoms, such as vomiting, severe headache, stiff or painful neck, chest pain, or shortness of breath. This information is not intended to replace advice given to you by your health care provider. Make sure you discuss any questions you have with your health care provider. Document Revised:  04/24/2020 Document Reviewed: 04/24/2020 Elsevier Patient Education  [place].

## 2021-08-28 DIAGNOSIS — M62830 Muscle spasm of back: Secondary | ICD-10-CM | POA: Diagnosis not present

## 2021-08-28 DIAGNOSIS — M5416 Radiculopathy, lumbar region: Secondary | ICD-10-CM | POA: Diagnosis not present

## 2021-08-28 DIAGNOSIS — M9905 Segmental and somatic dysfunction of pelvic region: Secondary | ICD-10-CM | POA: Diagnosis not present

## 2021-08-28 DIAGNOSIS — M9903 Segmental and somatic dysfunction of lumbar region: Secondary | ICD-10-CM | POA: Diagnosis not present

## 2021-09-03 DIAGNOSIS — M9905 Segmental and somatic dysfunction of pelvic region: Secondary | ICD-10-CM | POA: Diagnosis not present

## 2021-09-03 DIAGNOSIS — M5416 Radiculopathy, lumbar region: Secondary | ICD-10-CM | POA: Diagnosis not present

## 2021-09-03 DIAGNOSIS — M62830 Muscle spasm of back: Secondary | ICD-10-CM | POA: Diagnosis not present

## 2021-09-03 DIAGNOSIS — M9903 Segmental and somatic dysfunction of lumbar region: Secondary | ICD-10-CM | POA: Diagnosis not present

## 2021-09-05 DIAGNOSIS — M5416 Radiculopathy, lumbar region: Secondary | ICD-10-CM | POA: Diagnosis not present

## 2021-09-05 DIAGNOSIS — M9905 Segmental and somatic dysfunction of pelvic region: Secondary | ICD-10-CM | POA: Diagnosis not present

## 2021-09-05 DIAGNOSIS — M62830 Muscle spasm of back: Secondary | ICD-10-CM | POA: Diagnosis not present

## 2021-09-05 DIAGNOSIS — M9903 Segmental and somatic dysfunction of lumbar region: Secondary | ICD-10-CM | POA: Diagnosis not present

## 2021-09-09 DIAGNOSIS — M9903 Segmental and somatic dysfunction of lumbar region: Secondary | ICD-10-CM | POA: Diagnosis not present

## 2021-09-09 DIAGNOSIS — M9905 Segmental and somatic dysfunction of pelvic region: Secondary | ICD-10-CM | POA: Diagnosis not present

## 2021-09-09 DIAGNOSIS — M5416 Radiculopathy, lumbar region: Secondary | ICD-10-CM | POA: Diagnosis not present

## 2021-09-09 DIAGNOSIS — M62830 Muscle spasm of back: Secondary | ICD-10-CM | POA: Diagnosis not present

## 2021-12-26 ENCOUNTER — Telehealth: Payer: Self-pay | Admitting: Family Medicine

## 2021-12-26 MED ORDER — OSELTAMIVIR PHOSPHATE 75 MG PO CAPS: 75.0000 mg | ORAL_CAPSULE | Freq: Every day | ORAL | 0 refills | Status: DC

## 2021-12-26 NOTE — Telephone Encounter (Signed)
Plz notify I've sent in tamiflu to take preventatively - 1 tablet daily for 10 days.

## 2021-12-26 NOTE — Addendum Note (Signed)
Addended by: Eustaquio Boyden on: 12/26/2021 11:14 PM   Modules accepted: Orders

## 2021-12-26 NOTE — Telephone Encounter (Signed)
Patient wife called and stated his wife gotten diagnosed with flu on yesterday and he wanted to know if there anything he can do to prevent it. Call back number [phone]

## 2021-12-27 NOTE — Telephone Encounter (Signed)
Spoke with pt relaying Dr. G's message.  Pt verbalizes understanding and expresses his thanks.  

## 2022-06-23 IMAGING — US US EXTREM LOW*R* LIMITED
1 series · 15 of 15 positions shown · non-contrast
Comparison: Correlation may with clinical photographs the patient's
anterior thigh mass.

CLINICAL DATA: Right thigh mass. Mass present for multiple years,
nontender.

EXAM:
ULTRASOUND RIGHT LOWER EXTREMITY LIMITED
TECHNIQUE: Ultrasound examination of the lower extremity soft tissues was
performed in the area of clinical concern.

[Series 1: us extrem low bilat ltd · 15 acquisitions, 15 frames shown]
[im 1/15]
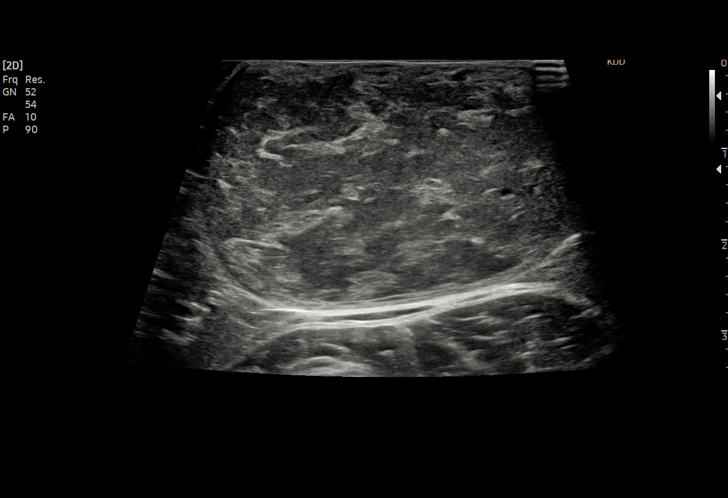
[im 2/15]
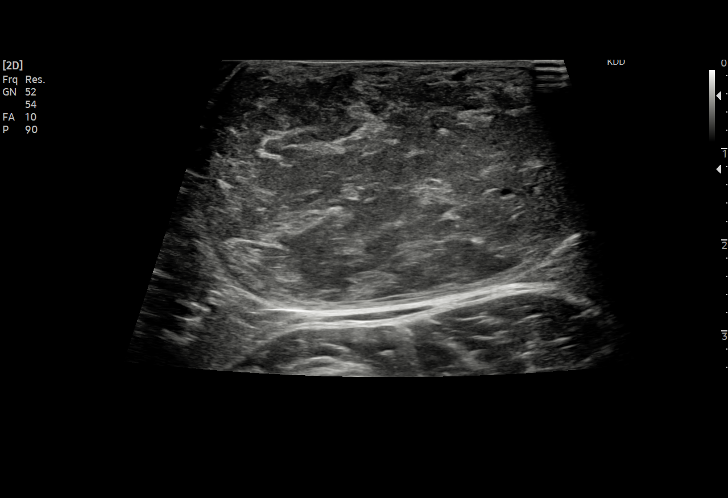
[im 3/15]
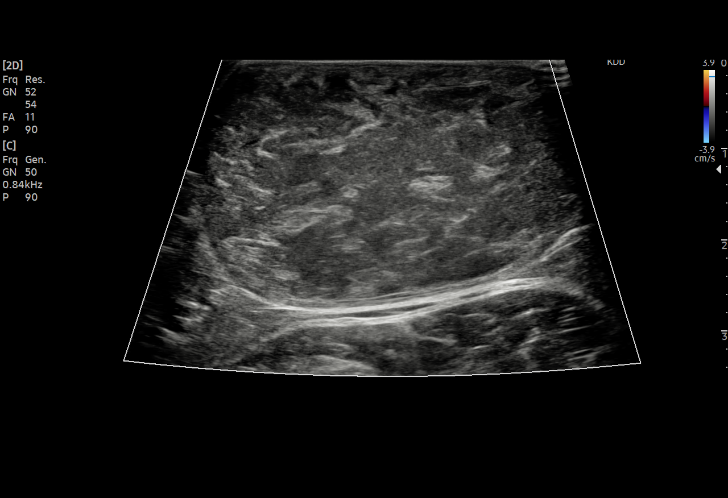
[im 4/15]
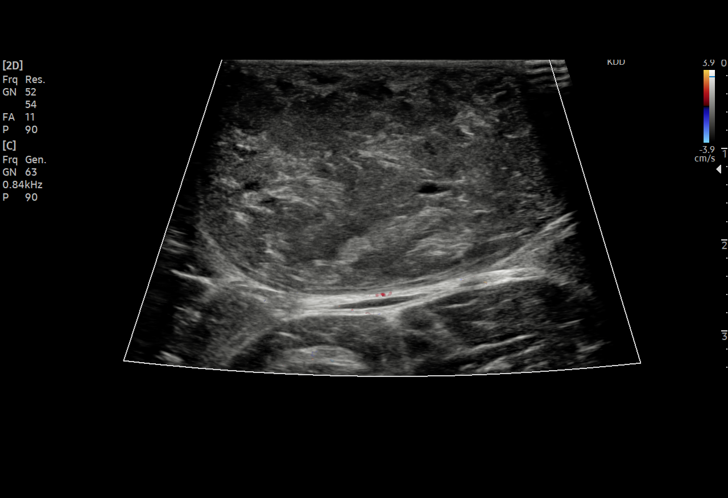
[im 5/15]
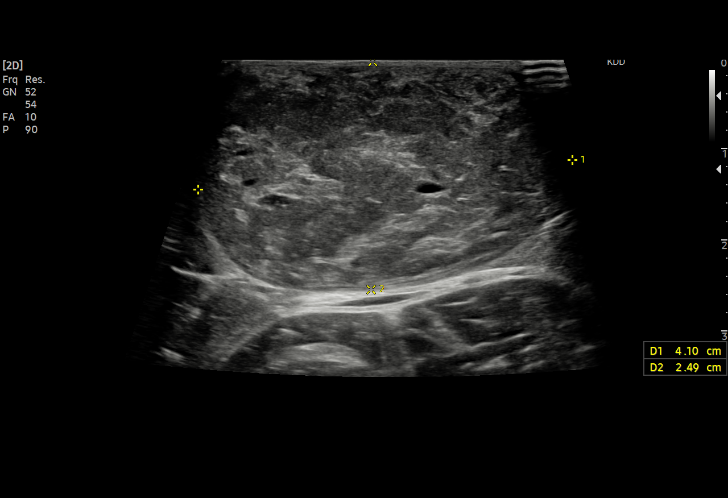
[im 6/15]
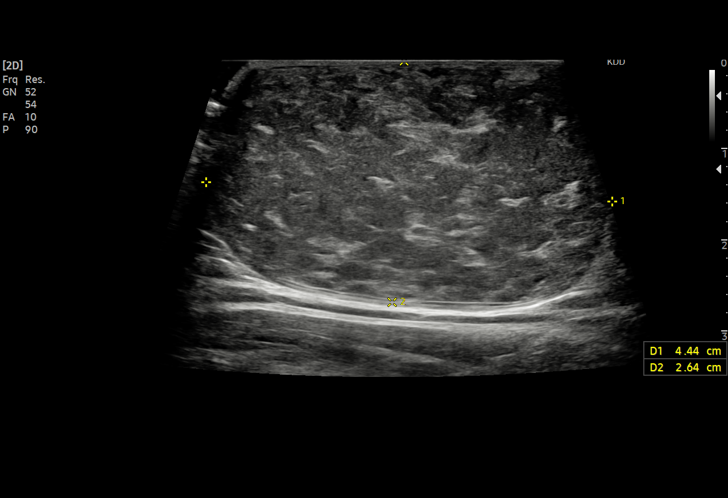
[im 7/15]
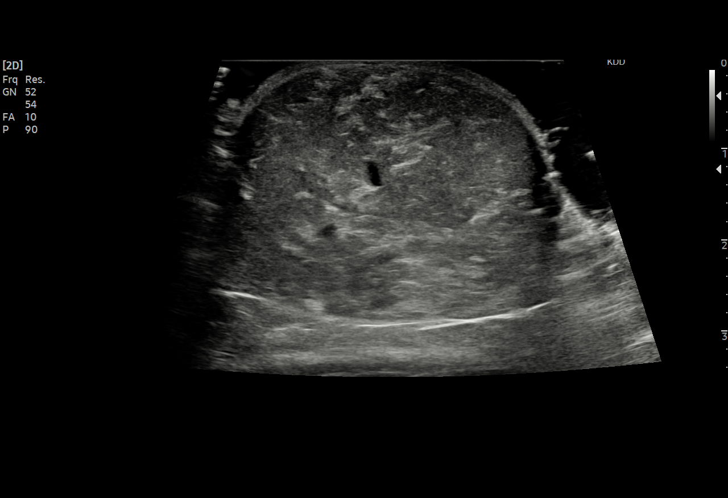
[im 8/15]
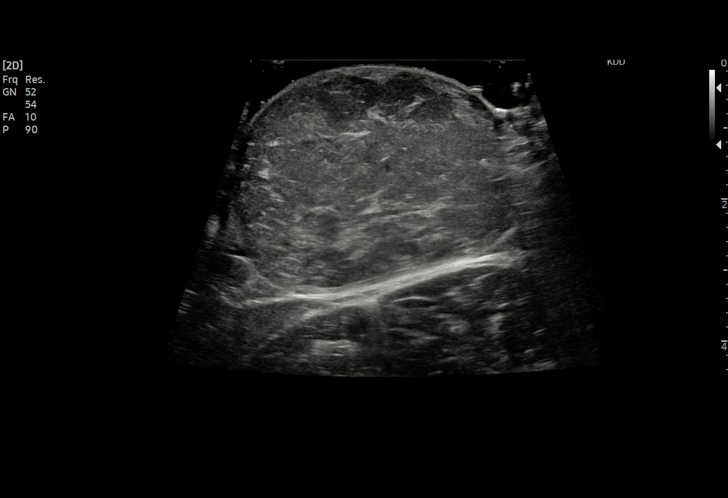
[im 9/15]
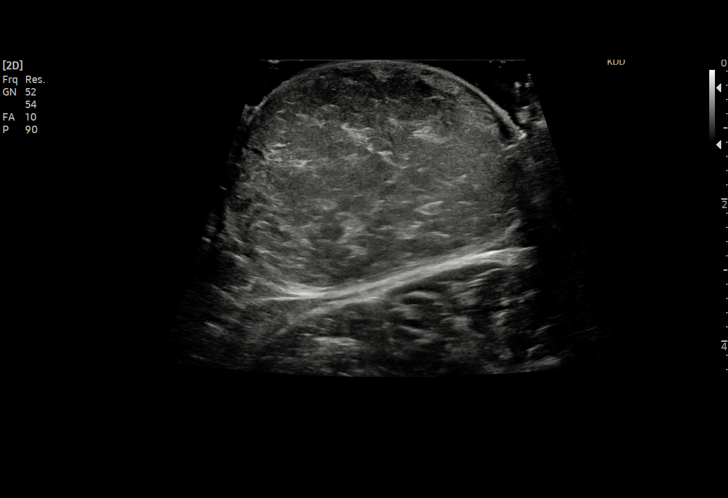
[im 10/15]
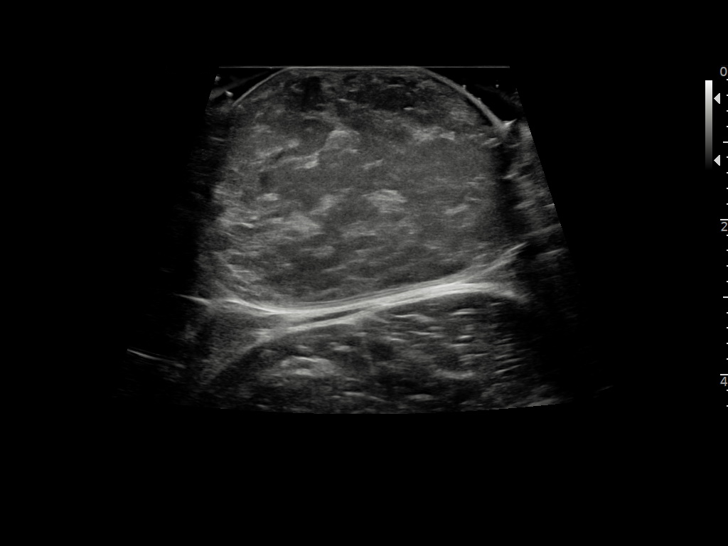
[im 11/15]
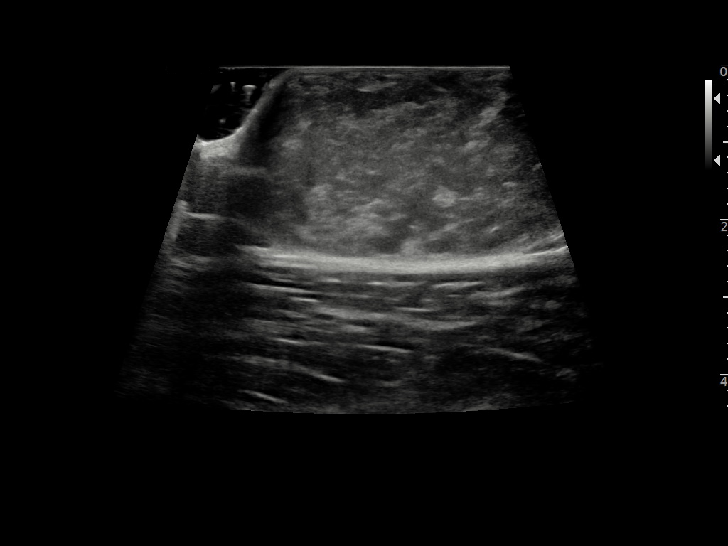
[im 12/15]
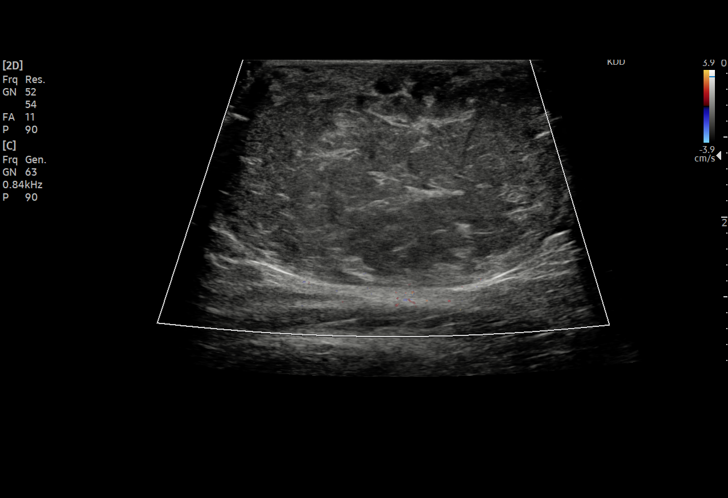
[im 13/15]
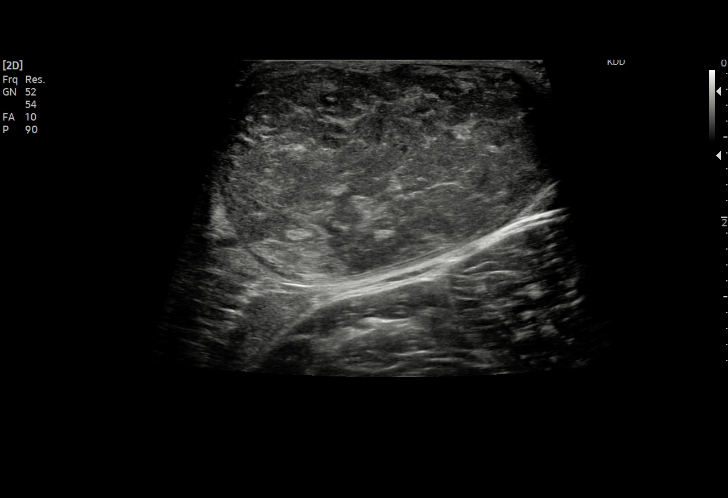
[im 14/15]
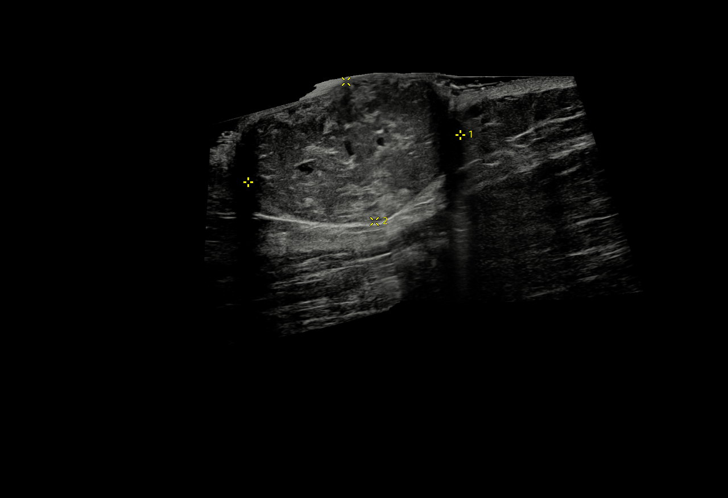
[im 15/15]
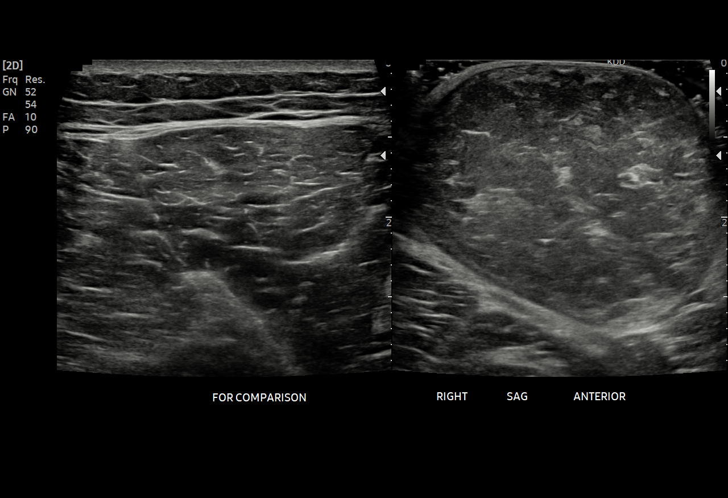

[15 of 15 positions shown; findings below may reference images not displayed]

FINDINGS: Oval circumscribed heterogeneous mass is centered on the
subcutaneous soft tissues, depressing the underlying anterior muscle
fascia and bulging the overlying skin contour. Mass measures 4.1 x
2.6 x 4.4 cm. No internal blood flow detected on color Doppler
analysis.
IMPRESSION: 1. Heterogeneous soft tissue mass along the anterior right thigh
corresponds to the clinically apparent anterior thigh mass.
Sonographically the mass measures 4.1 cm in greatest dimension. The
mass could reflect a lipoma, but is nonspecific. Circumscribed
margins support a nonaggressive mass. If desired clinically, the
mass could be further characterized with MRI without and with
contrast.

## 2022-08-04 ENCOUNTER — Other Ambulatory Visit: Payer: Self-pay | Admitting: Family Medicine

## 2022-08-04 ENCOUNTER — Other Ambulatory Visit (INDEPENDENT_AMBULATORY_CARE_PROVIDER_SITE_OTHER): Payer: BC Managed Care – PPO

## 2022-08-04 DIAGNOSIS — Z13220 Encounter for screening for lipoid disorders: Secondary | ICD-10-CM

## 2022-08-04 DIAGNOSIS — Z1159 Encounter for screening for other viral diseases: Secondary | ICD-10-CM

## 2022-08-04 DIAGNOSIS — Z136 Encounter for screening for cardiovascular disorders: Secondary | ICD-10-CM

## 2022-08-04 DIAGNOSIS — Z131 Encounter for screening for diabetes mellitus: Secondary | ICD-10-CM

## 2022-08-04 LAB — BASIC METABOLIC PANEL
BUN: 21 mg/dL (ref 6–23)
CO2: 25 mEq/L (ref 19–32)
Calcium: 9.5 mg/dL (ref 8.4–10.5)
Chloride: 104 mEq/L (ref 96–112)
Creatinine, Ser: 0.8 mg/dL (ref 0.40–1.50)
GFR: 115.8 mL/min (ref >60.00)
Glucose, Bld: 99 mg/dL (ref 70–99)
Potassium: 4.6 mEq/L (ref 3.5–5.1)
Sodium: 139 mEq/L (ref 135–145)

## 2022-08-04 LAB — LIPID PANEL
Cholesterol: 164 mg/dL (ref 0–200)
HDL: 47.5 mg/dL (ref >39.00)
LDL Cholesterol: 100 mg/dL — ABNORMAL HIGH (ref 0–99)
NonHDL: 116.17
Total CHOL/HDL Ratio: 3
Triglycerides: 81 mg/dL (ref 0.0–149.0)
VLDL: 16.2 mg/dL (ref 0.0–40.0)

## 2022-08-05 LAB — HEPATITIS C ANTIBODY: Hepatitis C Ab: NONREACTIVE

## 2022-08-11 ENCOUNTER — Ambulatory Visit (INDEPENDENT_AMBULATORY_CARE_PROVIDER_SITE_OTHER): Payer: BC Managed Care – PPO | Admitting: Family Medicine

## 2022-08-11 ENCOUNTER — Encounter: Payer: Self-pay | Admitting: Family Medicine

## 2022-08-11 ENCOUNTER — Telehealth: Payer: Self-pay

## 2022-08-11 VITALS — BP 124/72 | HR 55 | Temp 97.5°F | Ht 70.5 in | Wt 187.5 lb

## 2022-08-11 DIAGNOSIS — S161XXA Strain of muscle, fascia and tendon at neck level, initial encounter: Secondary | ICD-10-CM | POA: Insufficient documentation

## 2022-08-11 DIAGNOSIS — Z23 Encounter for immunization: Secondary | ICD-10-CM | POA: Diagnosis not present

## 2022-08-11 DIAGNOSIS — Z0000 Encounter for general adult medical examination without abnormal findings: Secondary | ICD-10-CM

## 2022-08-11 DIAGNOSIS — Z111 Encounter for screening for respiratory tuberculosis: Secondary | ICD-10-CM

## 2022-08-11 DIAGNOSIS — Z0279 Encounter for issue of other medical certificate: Secondary | ICD-10-CM

## 2022-08-11 DIAGNOSIS — Z0001 Encounter for general adult medical examination with abnormal findings: Secondary | ICD-10-CM

## 2022-08-11 NOTE — Progress Notes (Signed)
PPD Placement note Eric Morse, 32 y.o. male is here today for placement of PPD test Reason for PPD test: Work Pt taken PPD test before: Pt reports that he doesn't remember having one before.  Verified in allergy area and with patient that they are not allergic to the products PPD is made of (Phenol or Tween). Yes Is patient taking any oral or IV steroid medication now or have they taken it in the last month? no Has the patient ever received the BCG vaccine?: Unknown Has the patient been in recent contact with anyone known or suspected of having active TB disease?: no      Date of exposure (if applicable): NA      Name of person they were exposed to (if applicable): NA Patient's Country of origin?: NA O: Alert and oriented in NAD. P:  PPD placed on 08/11/2022.  Patient advised to return for reading within 48-72 hours.

## 2022-08-11 NOTE — Telephone Encounter (Signed)
Pt scheduled for a nurse visit 08/13/22 @ 9:30am to have PPD read but appointment is too early.  PPD must be read 48-72 hours after placement.  Called pt and left a message to reschedule.  PDD was placed 08/11/22 @ 1:30pm.

## 2022-08-11 NOTE — Patient Instructions (Addendum)
Tdap today  PPD placed today  You are doing well today Return as needed or in 1-2 years for next physical.  Let us know if neck pain not improving with time.

## 2022-08-11 NOTE — Assessment & Plan Note (Signed)
Rec supportive measures. Update if not improving as expected

## 2022-08-11 NOTE — Assessment & Plan Note (Signed)
PPD placed today. Health exam certificate form held for PPD results.

## 2022-08-11 NOTE — Assessment & Plan Note (Signed)
Preventative protocols reviewed and updated unless pt declined. Discussed healthy diet and lifestyle.  

## 2022-08-11 NOTE — Telephone Encounter (Signed)
Looks like pt is scheduled on 08/14/22 at 8:00.

## 2022-08-11 NOTE — Progress Notes (Addendum)
Ph: [phone] Fax: [phone]   Patient ID: Eric Morse, male    DOB: 01-May-1990, 32 y.o.   MRN: [id number]  This visit was conducted in person.  BP 124/72   Pulse (!) 55   Temp (!) 97.5 F (36.4 C) (Temporal)   Ht 5' 10.5" (1.791 m)   Wt 187 lb 8 oz (85 kg)   SpO2 98%   BMI 26.52 kg/m   Hearing Screening   500Hz  1000Hz  2000Hz  4000Hz   Right ear 20 20 20 25   Left ear 40 0 20 20   Vision Screening   Right eye Left eye Both eyes  Without correction 20/20 20/20 20/20   With correction     Denies hearing trouble   CC: CPE Subjective:   HPI: Eric Morse is a 32 y.o. male presenting on 08/11/2022 for Annual Exam (Needs work health screening form completed. )   4yo son treated for leukemia 2022 s/p bone marrow transplant and CAR-T therapy through [facility]. He relapsed last year - s/p BMT with radiation and chemo Spring 2024.   Some R sided neck pain that started while taking shirt off several weeks ago - initial pain for several days, now notes decreased ROM especially with neck flexion. H/o neck injuries in high school - stingers. No shooting pain down arms or numbness or tingling or weakness of arms.   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   Preventative: Flu - yearly COVID vaccine Pfizer 03/2019 x2, booster x1  Tdap 01/2012, update today  PPD 01/2012 negative Seat belt use discussed Sunscreen use discussed. No changing moles on skin.  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 Interim medical history since our last visit reviewed. Allergies and medications reviewed and updated. Outpatient Medications Prior to Visit  Medication Sig Dispense Refill  . Multiple Vitamin (MULTIVITAMIN ADULT) TABS Take 1 tablet by mouth daily.    . cefdinir (OMNICEF) 300 MG capsule Take 2 capsules (600 mg total) by mouth daily. 20 capsule 0  . ibuprofen (ADVIL) 800 MG tablet Take 1 tablet (800 mg total) by mouth every 8 (eight) hours as needed. 30 tablet 0  . oseltamivir (TAMIFLU) 75 MG capsule Take 1 capsule (75 mg total) by mouth daily. 10 capsule 0   No facility-administered medications prior to visit.     Per HPI unless specifically indicated in ROS section below Review of Systems  Constitutional:  Negative for activity change, appetite change, chills, fatigue, fever and unexpected weight change.  HENT:  Negative for hearing loss.   Eyes:  Negative for visual disturbance.  Respiratory:  Negative for cough, chest tightness, shortness of breath and wheezing.   Cardiovascular:  Negative for chest pain, palpitations and leg swelling.  Gastrointestinal:  Negative for abdominal distention, abdominal pain, blood in stool, constipation, diarrhea, nausea and vomiting.  Genitourinary:  Negative for difficulty urinating and hematuria.  Musculoskeletal:  Negative for arthralgias, myalgias and neck pain.  Skin:  Negative for  rash.  Neurological:  Negative for dizziness, seizures, syncope and headaches.  Hematological:  Negative for adenopathy. Does not bruise/bleed easily.  Psychiatric/Behavioral:  Negative for dysphoric mood. The patient is not nervous/anxious.     Objective:  BP 124/72   Pulse (!) 55   Temp (!) 97.5 F (36.4 C) (Temporal)   Ht 5' 10.5" (1.791 m)   Wt 187 lb 8 oz (85 kg)   SpO2 98%   BMI 26.52 kg/m   Wt Readings from Last 3 Encounters:  08/11/22 187 lb 8 oz (85 kg)  06/07/21 190 lb (86.2 kg)  05/24/21 190 lb 6 oz (86.4  kg)      Physical Exam Vitals and nursing note reviewed.  Constitutional:      General: He is not in acute distress.    Appearance: Normal appearance. He is well-developed. He is not ill-appearing.  HENT:     Head: Normocephalic and atraumatic.     Right Ear: Hearing, tympanic membrane, ear canal and external ear normal.     Left Ear: Hearing, ear canal and external ear normal.     Ears:     Comments: Mild maceration noted to L TM     Nose: Nose normal.     Mouth/Throat:     Mouth: Mucous membranes are moist.     Pharynx: Oropharynx is clear. No oropharyngeal exudate or posterior oropharyngeal erythema.  Eyes:     General: No scleral icterus.    Extraocular Movements: Extraocular movements intact.     Conjunctiva/sclera: Conjunctivae normal.     Pupils: Pupils are equal, round, and reactive to light.  Neck:     Thyroid: No thyroid mass or thyromegaly.     Comments:  FROM cervical neck without midline or paracervical mm tenderness  Neg spurling bilaterally Cardiovascular:     Rate and Rhythm: Normal rate and regular rhythm.     Pulses: Normal pulses.          Radial pulses are 2+ on the right side and 2+ on the left side.     Heart sounds: Normal heart sounds. No murmur heard. Pulmonary:     Effort: Pulmonary effort is normal. No respiratory distress.     Breath sounds: Normal breath sounds. No wheezing, rhonchi or rales.  Abdominal:     General: Abdomen is flat. Bowel sounds are normal. There is no distension.     Palpations: Abdomen is soft. There is no mass.     Tenderness: There is no abdominal tenderness. There is no guarding or rebound.     Hernia: A hernia is present. Hernia is present in the umbilical area (small).    Musculoskeletal:        General: Normal range of motion.     Cervical back: Normal range of motion and neck supple.     Right lower leg: No edema.     Left lower leg: No edema.  Lymphadenopathy:     Cervical: No cervical adenopathy.  Skin:     General: Skin is warm and dry.     Findings: No rash.  Neurological:     General: No focal deficit present.     Mental Status: He is alert and oriented to person, place, and time.     Comments:  Neg spurling 5/5 strength BUE, grip strength intact  Psychiatric:        Mood and Affect: Mood normal.        Behavior: Behavior normal.  Thought Content: Thought content normal.        Judgment: Judgment normal.      Results for orders placed or performed in visit on 08/04/22  Hepatitis C antibody  Result Value Ref Range   Hepatitis C Ab NON-REACTIVE NON-REACTIVE  Basic metabolic panel  Result Value Ref Range   Sodium 139 135 - 145 mEq/L   Potassium 4.6 3.5 - 5.1 mEq/L   Chloride 104 96 - 112 mEq/L   CO2 25 19 - 32 mEq/L   Glucose, Bld 99 70 - 99 mg/dL   BUN 21 6 - 23 mg/dL   Creatinine, Ser 0.45 0.40 - 1.50 mg/dL   GFR 409.81 >19.14 mL/min   Calcium 9.5 8.4 - 10.5 mg/dL  Lipid panel  Result Value Ref Range   Cholesterol 164 0 - 200 mg/dL   Triglycerides 78.2 0.0 - 149.0 mg/dL   HDL 95.62 >13.08 mg/dL   VLDL 65.7 0.0 - 84.6 mg/dL   LDL Cholesterol 962 (H) 0 - 99 mg/dL   Total CHOL/HDL Ratio 3    NonHDL 116.17     Assessment & Plan:   Problem List Items Addressed This Visit     Health care maintenance - Primary (Chronic)    Preventative protocols reviewed and updated unless pt declined. Discussed healthy diet and lifestyle.       Relevant Orders   TB Skin Test (Completed)   Encounter for issuance of medical certificate    PPD placed today. Health exam certificate form held for PPD results.       Neck strain    Rec supportive measures. Update if not improving as expected      Other Visit Diagnoses     Encounter for immunization       Relevant Orders   Tdap vaccine greater than or equal to 7yo IM (Completed)        No orders of the defined types were placed in this encounter.   Orders Placed This Encounter  Procedures  . Tdap vaccine greater than  or equal to 7yo IM  . TB Skin Test    Order Specific Question:   Has patient ever tested positive?    Answer:   No    Patient Instructions  Tdap today  PPD placed today  You are doing well today Return as needed or in 1-2 years for next physical.  Let us know if neck pain not improving with time.   Follow up plan: Return in about 1 year (around 08/11/2023) for annual exam, prior fasting for blood work.  Eustaquio Boyden, MD

## 2022-08-13 ENCOUNTER — Ambulatory Visit: Payer: BC Managed Care – PPO

## 2022-08-14 ENCOUNTER — Ambulatory Visit: Payer: BC Managed Care – PPO

## 2022-08-14 NOTE — Progress Notes (Signed)
PPD Reading Note  PPD read and results entered in EpicCare.  Result: 0 mm induration.  Interpretation: negative  If test not read within 48-72 hours of initial placement, patient advised to repeat in other arm 1-3 weeks after this test.  Allergic reaction: no

## 2022-08-15 ENCOUNTER — Telehealth: Payer: Self-pay

## 2022-08-15 NOTE — Telephone Encounter (Signed)
Left message on vm per dpr notifying pt his ABSS Health Exam Certificate is ready to pick up.  [Placed form at front office. Made copy to scan.]

## 2022-12-03 ENCOUNTER — Telehealth: Payer: BC Managed Care – PPO | Admitting: Family Medicine

## 2022-12-03 DIAGNOSIS — R0781 Pleurodynia: Secondary | ICD-10-CM

## 2022-12-03 NOTE — Patient Instructions (Addendum)
Eric Morse, thank you for joining Freddy Finner, NP for today's virtual visit.  While this provider is not your primary care provider (PCP), if your PCP is located in our provider database this encounter information will be shared with them immediately following your visit.   A [facility] MyChart account gives you access to today's visit and all your visits, tests, and labs performed at [facility] " click here if you don't have a [facility] MyChart account or go to [email or web address]  Consent: (Patient) Eric Prime Digangi provided verbal consent for this virtual visit at the beginning of the encounter.  Current Medications:  Current Outpatient Medications:    Multiple Vitamin (MULTIVITAMIN ADULT) TABS, Take 1 tablet by mouth daily., Disp: , Rfl:    Medications ordered in this encounter:  No orders of the defined types were placed in this encounter.    *If you need refills on other medications prior to your next appointment, please contact your pharmacy*  Follow-Up: Call back or seek an in-person evaluation if the symptoms worsen or if the condition fails to improve as anticipated.  [facility] [phone]  Other Instructions  -most likely a contusion of rib cage related to the impact during a football game -continue activity, but limit the duration and level of intensity for a week or so (go off comfort level) -OTC pain med relief as needed  -follow up in 2-4 weeks if not noting improvement -can ace wrap the trunk of body for more stability if needed, but it is not necessary   Chest Wall Pain Chest wall pain is pain in or around the bones and muscles of your chest. Chest wall pain may be caused by: An injury. Coughing a lot. Using your chest and arm muscles too much. Sometimes, the cause may not be known. This pain may take a few weeks or longer to get better. Follow these instructions at home: Managing pain, stiffness, and  swelling If told, put ice on the painful area: Put ice in a plastic bag. Place a towel between your skin and the bag. Leave the ice on for 20 minutes, 2-3 times a day.  Activity Rest as told by your doctor. Avoid doing things that cause pain. This includes lifting heavy items. Ask your doctor what activities are safe for you. General instructions  Take over-the-counter and prescription medicines only as told by your doctor. Do not use any products that contain nicotine or tobacco, such as cigarettes, e-cigarettes, and chewing tobacco. If you need help quitting, ask your doctor. Keep all follow-up visits as told by your doctor. This is important. Contact a doctor if: You have a fever. Your chest pain gets worse. You have new symptoms. Get help right away if: You feel sick to your stomach (nauseous) or you throw up (vomit). You feel sweaty or light-headed. You have a cough with mucus from your lungs (sputum) or you cough up blood. You are short of breath. These symptoms may be an emergency. Do not wait to see if the symptoms will go away. Get medical help right away. Call your local emergency services (911 in the [place] Do not drive yourself to the hospital. Summary Chest wall pain is pain in or around the bones and muscles of your chest. It may be treated with ice, rest, and medicines. Your condition may also get better if you avoid doing things that cause pain. Contact a doctor if you have a fever, chest pain that gets  worse, or new symptoms. Get help right away if you feel light-headed or you get short of breath. These symptoms may be an emergency. This information is not intended to replace advice given to you by your health care provider. Make sure you discuss any questions you have with your health care provider. Document Revised: 12/23/2021 Document Reviewed: 12/23/2021 Elsevier Patient Education  2024 [employer]   If you have been instructed to have an in-person  evaluation today at a local Urgent Care facility, please use the link below. It will take you to a list of all of our available [facility], including address, phone number and hours of operation. Please do not delay care.  [facility]  If you or a family member do not have a primary care provider, use the link below to schedule a visit and establish care. When you choose a [facility] primary care physician or advanced practice provider, you gain a long-term partner in health. Find a Primary Care Provider  Learn more about [facility]'s in-office and virtual care options: [facility] - Get Care Now

## 2022-12-03 NOTE — Progress Notes (Signed)
Virtual Visit Consent   Eric Morse, you are scheduled for a virtual visit with a [facility] provider today. Just as with appointments in the office, your consent must be obtained to participate. Your consent will be active for this visit and any virtual visit you may have with one of our providers in the next 365 days. If you have a MyChart account, a copy of this consent can be sent to you electronically.  As this is a virtual visit, video technology does not allow for your provider to perform a traditional examination. This may limit your provider's ability to fully assess your condition. If your provider identifies any concerns that need to be evaluated in person or the need to arrange testing (such as labs, EKG, etc.), we will make arrangements to do so. Although advances in technology are sophisticated, we cannot ensure that it will always work on either your end or our end. If the connection with a video visit is poor, the visit may have to be switched to a telephone visit. With either a video or telephone visit, we are not always able to ensure that we have a secure connection.  By engaging in this virtual visit, you consent to the provision of healthcare and authorize for your insurance to be billed (if applicable) for the services provided during this visit. Depending on your insurance coverage, you may receive a charge related to this service.  I need to obtain your verbal consent now. Are you willing to proceed with your visit today? Eric Morse has provided verbal consent on 12/03/2022 for a virtual visit (video or telephone). Freddy Finner, NP  Date: 12/03/2022 10:02 AM  Virtual Visit via Video Note   I, Freddy Finner, connected with  Eric Morse  ([id number], [birth year]) on 12/03/22 at 10:00 AM EST by a video-enabled telemedicine application and verified that I am speaking with the correct person using two identifiers.  Location: Patient: Virtual Visit Location  Patient: Other: work Provider: Pharmacist, community: Home Office   I discussed the limitations of evaluation and management by telemedicine and the availability of in person appointments. The patient expressed understanding and agreed to proceed.    History of Present Illness: Eric Morse is a 32 y.o. who identifies as a male who was assigned male at birth, and is being seen today for rib cage pain  Onset was Saturday got hit in rib cage during a football game. Coughing hurts it at times and using core to sit up- at end of the day hurting more is noted as he was running and being just as active as he was prior injury.  Associated symptoms are as above Modifying factors are ibuprofen 600 mg yesterday and today- noted improvement with it Denies chest pain, shortness of breath, fevers, chills, trouble breathing    Problems:  Patient Active Problem List   Diagnosis Date Noted   Encounter for issuance of medical certificate 40/98/1191   Neck strain 08/11/2022   Strep pharyngitis 05/24/2021   Health care maintenance 03/04/2011    Allergies: No Known Allergies Medications:  Current Outpatient Medications:    Multiple Vitamin (MULTIVITAMIN ADULT) TABS, Take 1 tablet by mouth daily., Disp: , Rfl:   Observations/Objective: Patient is well-developed, well-nourished in no acute distress.  Resting comfortably  at home.  Head is normocephalic, atraumatic.  No labored breathing.  Speech is clear and coherent with logical content.  Patient is alert and oriented at baseline.  Assessment and Plan:  1. Rib pain on left side  -most likely a contusion of rib cage related to the impact during a football game -continue activity, but limit the duration and level of intensity for a week or so (go off comfort level) -OTC pain med relief as needed  -follow up in 2-4 weeks if not noting improvement -can ace wrap the trunk of body for more stability if needed, but it is not  necessary   -info of rib pain and care on AVS  Reviewed side effects, risks and benefits of medication.    Patient acknowledged agreement and understanding of the plan.   Past Medical, Surgical, Social History, Allergies, and Medications have been Reviewed.     Follow Up Instructions: I discussed the assessment and treatment plan with the patient. The patient was provided an opportunity to ask questions and all were answered. The patient agreed with the plan and demonstrated an understanding of the instructions.  A copy of instructions were sent to the patient via MyChart unless otherwise noted below.    The patient was advised to call back or seek an in-person evaluation if the symptoms worsen or if the condition fails to improve as anticipated.    Freddy Finner, NP

## 2023-02-15 ENCOUNTER — Emergency Department: Payer: Self-pay

## 2023-02-15 ENCOUNTER — Other Ambulatory Visit: Payer: Self-pay

## 2023-02-15 DIAGNOSIS — X500XXA Overexertion from strenuous movement or load, initial encounter: Secondary | ICD-10-CM | POA: Diagnosis not present

## 2023-02-15 DIAGNOSIS — Y9367 Activity, basketball: Secondary | ICD-10-CM | POA: Insufficient documentation

## 2023-02-15 DIAGNOSIS — S99911A Unspecified injury of right ankle, initial encounter: Secondary | ICD-10-CM | POA: Diagnosis present

## 2023-02-15 DIAGNOSIS — S93401A Sprain of unspecified ligament of right ankle, initial encounter: Secondary | ICD-10-CM | POA: Diagnosis not present

## 2023-02-15 NOTE — ED Triage Notes (Signed)
Pt sts that he rolled his ankle while playing basketball. Pt sts that he has rolled his ankle before and it does not feel the same.

## 2023-02-16 ENCOUNTER — Emergency Department
Admission: EM | Admit: 2023-02-16 | Discharge: 2023-02-16 | Disposition: A | Discharge status: Home / Self Care | Payer: Self-pay | Attending: Emergency Medicine | Admitting: Emergency Medicine

## 2023-02-16 ENCOUNTER — Encounter: Payer: Self-pay | Admitting: Family Medicine

## 2023-02-16 DIAGNOSIS — S93401A Sprain of unspecified ligament of right ankle, initial encounter: Secondary | ICD-10-CM

## 2023-02-16 MED ORDER — ACETAMINOPHEN 500 MG PO TABS
1000.0000 mg | ORAL_TABLET | Freq: Once | ORAL | Status: AC
  Administered 2023-02-16: 1000 mg via ORAL
  Filled 2023-02-16: qty 2

## 2023-02-16 MED ORDER — KETOROLAC TROMETHAMINE 15 MG/ML IJ SOLN
15.0000 mg | Freq: Once | INTRAMUSCULAR | Status: AC
  Administered 2023-02-16: 15 mg via INTRAVENOUS
  Filled 2023-02-16: qty 1

## 2023-02-16 NOTE — ED Provider Notes (Signed)
[facility] Provider Note    Event Date/Time   First MD Initiated Contact with Patient 02/16/23 0134     (approximate)   History   Ankle Pain   HPI  Eric Morse is a 33 y.o. male who comes in with rolling his ankle while playing basketball.  Patient reports that prior to coming in he was playing basketball when he rolled his right ankle.  Did not fall down did not hit his head.  He reports difficulty bearing weight on the right ankle.  Reports having rolled his ankle previously but wanted to make sure he did not have any fracture.  Physical Exam   Triage Vital Signs: ED Triage Vitals  Encounter Vitals Group     BP 02/15/23 2220 (!) 152/84     Systolic BP Percentile --      Diastolic BP Percentile --      Pulse Rate 02/15/23 2220 62     Resp 02/15/23 2220 17     Temp 02/15/23 2220 98.4 F (36.9 C)     Temp Source 02/15/23 2220 Oral     SpO2 02/15/23 2220 97 %     Weight 02/15/23 2221 195 lb (88.5 kg)     Height 02/15/23 2221 5\' 11"  (1.803 m)     Head Circumference --      Peak Flow --      Pain Score 02/15/23 2221 6     Pain Loc --      Pain Education --      Exclude from Growth Chart --     Most recent vital signs: Vitals:   02/15/23 2220  BP: (!) 152/84  Pulse: 62  Resp: 17  Temp: 98.4 F (36.9 C)  SpO2: 97%     General: Awake, no distress.  CV:  Good peripheral perfusion.  Resp:  Normal effort.  Abd:  No distention.  Other:  Swelling noted to the right ankle.  Good distal pulse able to flex and extend partially although somewhat limited secondary to swelling.  No tenderness along the Achilles.  No foot tenderness no fibular tenderness   ED Results / Procedures / Treatments   Labs (all labs ordered are listed, but only abnormal results are displayed) Labs Reviewed - No data to display   EKG  My interpretation of EKG:    RADIOLOGY I have reviewed the xray personally and interpreted and no evidence of any  fracture  PROCEDURES:  Critical Care performed: No  Procedures   MEDICATIONS ORDERED IN ED: Medications - No data to display   IMPRESSION / MDM / ASSESSMENT AND PLAN / ED COURSE  I reviewed the triage vital signs and the nursing notes.   Patient's presentation is most consistent with acute, uncomplicated illness.    Ankle Most Likely DDx: Ankle sprain  DDx that was also considered d/t potential to cause harm, but was found less likely based on history and physical (as detailed above): -Ankle fracture- will use the [place] ankle rules to determine if xray is warranted -Unlikely Maisonneuve fracture given no tenderness at the proximal fibula -Sandrea Hughs Fracture-no dorsal midfoot pain (if normal xray and pain here can get weightbearing view to look for joint widening to suggest ligament injury) -Yetta Barre fracture-no 5th metatarsal tenderness -Achilles tendon rupture, able to point toes down on their own   Will provide Ace bandage, crutches, stay off of it until follow-up with orthopedics.  We discussed RICE   FINAL CLINICAL IMPRESSION(S) /  ED DIAGNOSES   Final diagnoses:  Sprain of right ankle, unspecified ligament, initial encounter     Rx / DC Orders   ED Discharge Orders     None        Note:  This document was prepared using Dragon voice recognition software and may include unintentional dictation errors.   Concha Se, MD 02/16/23 [phone]

## 2023-02-16 NOTE — ED Notes (Signed)
Ace wrap applied to R ankle and crutches fitted. Patient gave return demonstration of crutch use

## 2023-02-16 NOTE — Discharge Instructions (Signed)
You can take Tylenol 1 g every 8 hours, ibuprofen 600 every 6-8 hours with food to help with pain.  Use crutches to stay off of it and follow-up with orthopedics.  Please call to make an appointment

## 2023-02-20 ENCOUNTER — Telehealth: Payer: Self-pay

## 2023-02-20 NOTE — Transitions of Care (Post Inpatient/ED Visit) (Signed)
 Unable to reach patient by phone and left v/m requesting call back at [phone].        02/20/2023  Name: Eric Morse MRN: [id number] DOB: 1990-03-08  Today's TOC FU Call Status: Today's TOC FU Call Status:: Unsuccessful Call (1st Attempt) Unsuccessful Call (1st Attempt) Date: 02/20/23  Attempted to reach the patient regarding the most recent Inpatient/ED visit.  Follow Up Plan: Additional outreach attempts will be made to reach the patient to complete the Transitions of Care (Post Inpatient/ED visit) call.   Signature  Laray Arenas, LPN

## 2023-02-23 NOTE — Telephone Encounter (Signed)
 See mychart message.  Per chart review pt saw Dr Wilnette Haste 2/6 placed in tall walking boot for bad left lateral ankle sprain with planned f/u 2-3 wks, note reviewed.

## 2023-02-23 NOTE — Transitions of Care (Post Inpatient/ED Visit) (Signed)
 Unable to reach pt by phone.per 02/16/23 my chart note pt had scheduled appt with ortho on 02/19/23. Sending note to Dr Mariam Shingles.         02/23/2023  Name: Eric Morse MRN: [id number] DOB: [birth year]  Today's TOC FU Call Status: Today's TOC FU Call Status:: Unsuccessful Call (2nd Attempt) Unsuccessful Call (1st Attempt) Date: 02/20/23 Unsuccessful Call (2nd Attempt) Date: 02/23/23  Attempted to reach the patient regarding the most recent Inpatient/ED visit.  Follow Up Plan: No further outreach attempts will be made at this time. We have been unable to contact the patient.  Signature Claretha Crocker, LPN

## 2023-10-27 ENCOUNTER — Encounter: Payer: Self-pay | Admitting: Family Medicine

## 2023-12-21 ENCOUNTER — Encounter: Payer: Self-pay | Admitting: Family Medicine

## 2023-12-21 ENCOUNTER — Ambulatory Visit: Admitting: Family Medicine

## 2023-12-21 VITALS — BP 120/78 | HR 70 | Temp 98.6°F | Ht 71.0 in | Wt 197.6 lb

## 2023-12-21 DIAGNOSIS — I8392 Asymptomatic varicose veins of left lower extremity: Secondary | ICD-10-CM | POA: Insufficient documentation

## 2023-12-21 DIAGNOSIS — Z23 Encounter for immunization: Secondary | ICD-10-CM

## 2023-12-21 DIAGNOSIS — K429 Umbilical hernia without obstruction or gangrene: Secondary | ICD-10-CM | POA: Diagnosis not present

## 2023-12-21 NOTE — Assessment & Plan Note (Signed)
 Enlarging, becoming more symptomatic.  He desires definitive management - will refer to general surgeon for evaluation.

## 2023-12-21 NOTE — Progress Notes (Signed)
 Ph: [phone] Fax: [phone]   Patient ID: Eric Morse, male    DOB: [birth year], 33 y.o.   MRN: [id number]  This visit was conducted in person.  BP 120/78 (Cuff Size: Normal)   Pulse 70   Temp 98.6 F (37 C) (Oral)   Ht 5' 11 (1.803 m)   Wt 197 lb 9.6 oz (89.6 kg)   SpO2 98%   BMI 27.56 kg/m    CC: check umbilical hernia  Subjective:   HPI: Eric Morse is a 33 y.o. male presenting on 12/21/2023 for Acute Visit (Pt thinks they he may have hernia inside of belly button and left upper thigh//Belly button has been there years but started having onset of pain within the last year on and off, //Left upper thigh - onset 6months, pain is light but constant)   Umbilical hernia present for years - may be getting larger. Notes pressure to area when blowing his nose, uncomfortable with palpation.   Abnormal area to left groin he also wants evaluated. Has noted this spot for the past 6 months associated with Morse pain, worse with sneezing as well.   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  Increased running.  He's been working out more - more strength training and weight lifting this past month.      Relevant past medical, surgical, family and social history reviewed and updated as indicated. Interim medical history since our last visit reviewed. Allergies and medications reviewed and updated. Outpatient Medications Prior to Visit  Medication Sig Dispense Refill   Multiple Vitamin (MULTIVITAMIN ADULT) TABS Take 1 tablet by mouth daily. (Patient taking differently: Take 1 tablet by mouth daily.)     No facility-administered medications prior to visit.     Per HPI unless specifically indicated in ROS section below Review of Systems  Objective:  BP 120/78 (Cuff Size: Normal)   Pulse 70   Temp 98.6 F (37 C) (Oral)   Ht 5' 11 (1.803 m)   Wt 197 lb 9.6 oz (89.6 kg)   SpO2 98%   BMI 27.56 kg/m   Wt Readings from Last 3 Encounters:  12/21/23 197  lb 9.6 oz (89.6 kg)  02/15/23 195 lb (88.5 kg)  08/11/22 187 lb 8 oz (85 kg)      Physical Exam Vitals and nursing note reviewed.  Constitutional:      Appearance: Normal appearance. He is not ill-appearing.  HENT:     Head: Normocephalic and atraumatic.     Mouth/Throat:     Mouth: Mucous membranes are moist.     Pharynx: Oropharynx is clear. No oropharyngeal exudate or posterior oropharyngeal erythema.  Eyes:     Extraocular Movements: Extraocular movements intact.     Pupils: Pupils are equal, round, and reactive to light.  Neck:     Thyroid: No thyroid mass or thyromegaly.  Cardiovascular:     Rate and Rhythm: Normal rate and regular rhythm.     Pulses: Normal pulses.     Heart sounds: Normal heart sounds. No murmur heard. Pulmonary:     Effort: Pulmonary effort is normal. No respiratory distress.     Breath sounds: Normal breath sounds. No wheezing, rhonchi or rales.  Abdominal:     General: Bowel sounds are normal. There is no distension.     Palpations: Abdomen is soft. There is no mass.     Tenderness: There is no abdominal tenderness. There is no guarding or rebound. Negative signs  include Murphy's sign.     Hernia: A hernia is present. Hernia is present in the umbilical area. There is no hernia in the left inguinal area or right inguinal area.      Comments: Enlarged left sided umbilical swelling consistent with hernia, mildly tender to palpation and not reducible  Musculoskeletal:     Right lower leg: No edema.     Left lower leg: No edema.       Legs:     Comments:  Area anterior proximal left thigh with dilated superficial veins, tender to palpation  No palpable cords  No pitting edema  2+ DP bilaterally   Skin:    General: Skin is warm and dry.     Findings: No rash.  Neurological:     Mental Status: He is alert.  Psychiatric:        Mood and Affect: Mood normal.        Behavior: Behavior normal.         Assessment & Plan:   Problem List Items  Addressed This Visit     Umbilical hernia - Primary   Enlarging, becoming more symptomatic.  He desires definitive management - will refer to general surgeon for evaluation.       Relevant Orders   Ambulatory referral to General Surgery   Varicose veins of left thigh   Exam consistent with this. 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  To let me know if persistent discomfort or worsening for VVS evaluation.       Other Visit Diagnoses       Encounter for immunization       Relevant Orders   Flu vaccine trivalent PF, 6mos and older(Flulaval,Afluria,Fluarix,Fluzone) (Completed)        No orders of the defined types were placed in this encounter.   Orders Placed This Encounter  Procedures   Flu vaccine trivalent PF, 6mos and older(Flulaval,Afluria,Fluarix,Fluzone)   Ambulatory referral to General Surgery    Referral Priority:   Routine    Referral Type:   Surgical    Referral Reason:   Specialty Services Required    Requested Specialty:   General Surgery    Number of Visits Requested:   1    Patient Instructions  I will refer you to general surgeons at [facility] for further recommendations on enlarging umbilical hernia I think you have left anterior upper thigh varicose or swollen veins.  Conservative measures to start - leg elevation, avoiding salt/sodium in diet, good water intake. Consider compression therapy /stockings (thigh high). If no better let me know for vascular surgery evaluation.   Follow up plan: Return if symptoms worsen or fail to improve.  Anton Blas, MD

## 2023-12-21 NOTE — Assessment & Plan Note (Signed)
 Exam consistent with this. 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  To let me know if persistent discomfort or worsening for VVS evaluation.

## 2023-12-21 NOTE — Patient Instructions (Addendum)
 I will refer you to general surgeons at [facility] for further recommendations on enlarging umbilical hernia I think you have left anterior upper thigh varicose or swollen veins.  Conservative measures to start - leg elevation, avoiding salt/sodium in diet, good water intake. Consider compression therapy /stockings (thigh high). If no better let me know for vascular surgery evaluation.

## 2023-12-31 ENCOUNTER — Ambulatory Visit: Payer: Self-pay | Admitting: General Surgery

## 2023-12-31 ENCOUNTER — Encounter: Payer: Self-pay | Admitting: General Surgery

## 2023-12-31 VITALS — BP 112/64 | HR 61 | Ht 71.0 in | Wt 194.0 lb

## 2023-12-31 DIAGNOSIS — K429 Umbilical hernia without obstruction or gangrene: Secondary | ICD-10-CM

## 2023-12-31 NOTE — Patient Instructions (Signed)
 Follow-up with our office as needed.  Please call and ask to speak with a nurse if you develop questions or concerns.  Laparoscopic Surgery for Belly Hernias: What to Expect  Laparoscopic surgery for belly hernias is a procedure to treat a bulge of tissue that pushes through a weak area of the belly (ventral hernia). This procedure may be done right away if part of your intestine gets trapped inside the hernia and starts to lose its blood supply (strangulation). Laparoscopic surgery is done through small cuts using a scope with a light and camera (laparoscope). Tell a health care provider about: Any allergies you have. All medicines you're taking. These include vitamins, herbs, eye drops, creams, and over-the-counter medicines. Any problems you or family members have had with anesthesia. Any bleeding problems you have. Any surgeries you've had. Any medical conditions you have. Whether you're pregnant or may be pregnant. What are the risks? Your health care provider will talk with you about risks. These may include: Infection. Bleeding or blood clots. Damage to nearby structures in the belly. Trouble pooping or peeing. The hernia coming back after surgery. Allergy to the mesh, if a mesh was used. Fluid buildup in the area of the hernia. In some cases, your provider may need to switch from a laparoscopic procedure to a procedure that's done through a single, larger incision in the belly (open procedure). You may need an open procedure if: You have a hernia that's hard to repair. Your organs are hard to see with the laparoscope. You have bleeding problems during the laparoscopic procedure. What happens before the procedure?  Medicines Ask about changing or stopping: Any medicines you take. Any vitamins, herbs, or supplements you take. Do not take aspirin or ibuprofen  unless you're told to. Tests You may have an exam or testing, including: Blood tests. Pee tests. Ultrasound of  the belly. Chest X-ray. Electrocardiogram (ECG). Surgery safety For your safety, you may: Need to wash your skin with a soap that kills germs. Get antibiotics. Have your surgery site marked. Have hair removed at the surgery site. General instructions Do not smoke, vape, or use nicotine or tobacco for at least 4 weeks before the surgery. Ask if you'll be staying overnight in the hospital. If you'll be going home right after the procedure, plan to have a responsible adult: Drive you home from the hospital or clinic. You won't be allowed to drive. Stay with you for the time you're told. What happens during the procedure?  An IV will be put into a vein in your hand or arm. You may be given: A sedative to help you relax. Anesthesia to keep you from feeling pain. Many small cuts (incisions) will be made in your belly. Gas will be pumped into your belly through one of the cuts. This will make it easier for your surgeon to see inside your belly during the repair. A laparoscope will be inserted into your belly. This will send pictures to a monitor in the operating room. The instruments needed for the surgery will be placed through the other cuts. The tissue or intestines that make up the hernia will be moved back into place. The edges of the hernia may be stitched together. A piece of mesh may be used to close the hernia. Stitches, clips, or staples will be used to keep the mesh in place. Your cuts will be closed with stitches, skin glue, or tape strips. They may be covered with a bandage. These steps may vary. Ask what  you can expect. What happens after the procedure? You will be watched closely until you leave. This includes checking your pain level, blood pressure, heart rate, and breathing rate. You will continue to receive fluids and medicines through an IV. Your IV will be removed when you can drink clear fluids. This information is not intended to replace advice given to you by your  health care provider. Make sure you discuss any questions you have with your health care provider. Document Revised: 07/08/2022 Document Reviewed: 07/08/2022 Elsevier Patient Education  2024 [employer].

## 2024-01-01 NOTE — Progress Notes (Signed)
 Patient ID: Eric Morse, male   DOB: 08/15/1990, 33 y.o.   MRN: [id number] CC: Umbilical Hernia History of Present Illness Eric Morse is a 33 y.o. male with no significant past medical history who presents in consultation for umbilical hernia.  The patient reports that he has noticed a bulge in his abdomen right around his umbilicus for the last 3 to 4 years.  He says that it will get smaller when he lays down but does not completely reduce.  He reports that there is some pain with palpation of the area.  Denies any obstructive symptoms.  He says that there has been a slight discoloration but has not been erythematous.  He denies previous abdominal surgery.  He enjoys doing triathlons and has a marathon scheduled for February.  Past Medical History Past Medical History:  Diagnosis Date   Strep pharyngitis 05/24/2021       Past Surgical History:  Procedure Laterality Date   MASS EXCISION Right 07/04/2020   thigh dermal cyst [place], Honor, MD)    Allergies[1]  No current outpatient medications on file.   No current facility-administered medications for this visit.    Family History Family History  Problem Relation Age of Onset   Stroke Maternal Grandfather 6       multiple   Leukemia Son 1       dx age 28mo   Coronary artery disease Neg Hx    Diabetes Neg Hx    Cancer Neg Hx    Hypertension Neg Hx    Hyperlipidemia Neg Hx        Social History Social History[2]   Likes doing marathons    ROS Full ROS of systems performed and is otherwise negative there than what is stated in the HPI  Physical Exam Blood pressure 112/64, pulse 61, height 5' 11 (1.803 m), weight 194 lb (88 kg), SpO2 98%.  Alert and oriented x 3, no work of breathing room air, regular rate and rhythm, abdomen soft, nontender except for at his umbilical hernia.  There is a fat-containing umbilical hernia that is not completely reducible.  No surgical scars.  Data Reviewed Reviewed  primary care note he was referred here for umbilical hernia.  I have personally reviewed the patient's imaging and medical records.    Assessment    Patient with umbilical hernia that is fat-containing.  Plan    He enjoys doing triathlons and has a marathon scheduled for February.  He would like to hold off on surgery until after this race.  I discussed risk, benefits alternatives of umbilical hernia repair including risk of infection, bleeding, damage to underlying structures and recurrence.  I also discussed with him that there would need to be good lifting restrictions for 6 weeks.  I told him that I only felt fat in the hernia and is reasonable to hold off surgery until the spring.  He will call us  when he is ready for surgery.  A total of 45 minutes was spent reviewing the patient's chart, performing history and physical and discussing treatment options with the patient    Eric Morse      [1] No Known Allergies [2]  Social History Tobacco Use   Smoking status: Never    Passive exposure: Never   Smokeless tobacco: Never  Vaping Use   Vaping status: Never Used  Substance Use Topics   Alcohol use: Yes    Comment: once monthly, 3-4 beers at a time, no binging  Drug use: No

## 2024-02-03 ENCOUNTER — Encounter: Payer: Self-pay | Admitting: Family Medicine

## 2024-02-03 MED ORDER — OSELTAMIVIR PHOSPHATE 75 MG PO CAPS: 75.0000 mg | ORAL_CAPSULE | Freq: Two times a day (BID) | ORAL | 0 refills | Status: AC

## 2024-02-03 NOTE — Addendum Note (Signed)
 Addended by: RILLA BALLER on: 02/03/2024 06:27 PM   Modules accepted: Orders
